# Patient Record
Sex: Female | Born: 1975 | Race: White | Hispanic: No | Marital: Married | Smoking: Never smoker
Health system: Southern US, Community
[De-identification: ages and names within clinical notes are randomized; demographics above are authoritative.]

## PROBLEM LIST (undated history)

## (undated) DIAGNOSIS — F419 Anxiety disorder, unspecified: Secondary | ICD-10-CM

## (undated) DIAGNOSIS — F319 Bipolar disorder, unspecified: Secondary | ICD-10-CM

## (undated) HISTORY — DX: Anxiety disorder, unspecified: F41.9

## (undated) HISTORY — DX: Bipolar disorder, unspecified: F31.9

## (undated) HISTORY — PX: WISDOM TOOTH EXTRACTION: SHX21

---

## 1999-10-28 ENCOUNTER — Emergency Department (HOSPITAL_COMMUNITY): Admission: EM | Admit: 1999-10-28 | Discharge: 1999-10-28 | Payer: Self-pay | Admitting: Emergency Medicine

## 2002-03-31 ENCOUNTER — Other Ambulatory Visit: Admission: RE | Admit: 2002-03-31 | Discharge: 2002-03-31 | Payer: Self-pay | Admitting: Obstetrics and Gynecology

## 2003-07-09 ENCOUNTER — Other Ambulatory Visit: Admission: RE | Admit: 2003-07-09 | Discharge: 2003-07-09 | Payer: Self-pay | Admitting: Obstetrics and Gynecology

## 2004-01-01 ENCOUNTER — Inpatient Hospital Stay (HOSPITAL_COMMUNITY): Admission: AD | Admit: 2004-01-01 | Discharge: 2004-01-01 | Payer: Self-pay | Admitting: Obstetrics and Gynecology

## 2004-01-02 ENCOUNTER — Inpatient Hospital Stay (HOSPITAL_COMMUNITY): Admission: AD | Admit: 2004-01-02 | Discharge: 2004-01-05 | Payer: Self-pay | Admitting: Obstetrics and Gynecology

## 2005-02-09 ENCOUNTER — Other Ambulatory Visit: Admission: RE | Admit: 2005-02-09 | Discharge: 2005-02-09 | Payer: Self-pay | Admitting: Obstetrics and Gynecology

## 2006-03-01 ENCOUNTER — Other Ambulatory Visit: Admission: RE | Admit: 2006-03-01 | Discharge: 2006-03-01 | Payer: Self-pay | Admitting: Obstetrics and Gynecology

## 2007-03-21 ENCOUNTER — Ambulatory Visit (HOSPITAL_COMMUNITY): Admission: RE | Admit: 2007-03-21 | Discharge: 2007-03-21 | Payer: Self-pay | Admitting: Obstetrics and Gynecology

## 2007-04-05 ENCOUNTER — Ambulatory Visit (HOSPITAL_COMMUNITY): Admission: RE | Admit: 2007-04-05 | Discharge: 2007-04-05 | Payer: Self-pay | Admitting: Obstetrics and Gynecology

## 2007-05-17 ENCOUNTER — Ambulatory Visit (HOSPITAL_COMMUNITY): Admission: RE | Admit: 2007-05-17 | Discharge: 2007-05-17 | Payer: Self-pay | Admitting: Obstetrics and Gynecology

## 2007-06-03 ENCOUNTER — Inpatient Hospital Stay (HOSPITAL_COMMUNITY): Admission: AD | Admit: 2007-06-03 | Discharge: 2007-06-03 | Payer: Self-pay | Admitting: Obstetrics and Gynecology

## 2007-08-01 ENCOUNTER — Inpatient Hospital Stay (HOSPITAL_COMMUNITY): Admission: AD | Admit: 2007-08-01 | Discharge: 2007-08-03 | Payer: Self-pay | Admitting: Obstetrics and Gynecology

## 2008-10-22 ENCOUNTER — Emergency Department (HOSPITAL_COMMUNITY): Admission: EM | Admit: 2008-10-22 | Discharge: 2008-10-22 | Payer: Self-pay | Admitting: Internal Medicine

## 2010-09-28 ENCOUNTER — Inpatient Hospital Stay (HOSPITAL_COMMUNITY)
Admission: AD | Admit: 2010-09-28 | Discharge: 2010-09-29 | Payer: Self-pay | Source: Home / Self Care | Attending: Obstetrics and Gynecology | Admitting: Obstetrics and Gynecology

## 2010-09-28 LAB — URINALYSIS, ROUTINE W REFLEX MICROSCOPIC
Ketones, ur: NEGATIVE mg/dL
Leukocytes, UA: NEGATIVE
Nitrite: NEGATIVE
Specific Gravity, Urine: <1.005 — ABNORMAL LOW (ref 1.005–1.030)
Urobilinogen, UA: 0.2 mg/dL (ref 0.0–1.0)
pH: 6 (ref 5.0–8.0)

## 2010-09-28 LAB — URINE MICROSCOPIC-ADD ON

## 2010-09-28 LAB — WET PREP, GENITAL

## 2010-09-29 LAB — GC/CHLAMYDIA PROBE AMP, GENITAL
Chlamydia, DNA Probe: NEGATIVE
GC Probe Amp, Genital: NEGATIVE

## 2010-09-29 LAB — FETAL FIBRONECTIN: Fetal Fibronectin: NEGATIVE

## 2010-09-30 LAB — STREP B DNA PROBE: Strep Group B Ag: NEGATIVE

## 2010-11-28 ENCOUNTER — Inpatient Hospital Stay (HOSPITAL_COMMUNITY)
Admission: AD | Admit: 2010-11-28 | Discharge: 2010-12-01 | DRG: 373 | Disposition: A | Discharge status: Home / Self Care | Payer: BC Managed Care – PPO | Source: Ambulatory Visit | Attending: Obstetrics and Gynecology | Admitting: Obstetrics and Gynecology

## 2010-11-29 LAB — RPR: RPR Ser Ql: NONREACTIVE

## 2010-11-29 LAB — CBC
HCT: 36.5 % (ref 36.0–46.0)
Hemoglobin: 12.1 g/dL (ref 12.0–15.0)
MCH: 29.5 pg (ref 26.0–34.0)
MCHC: 33.2 g/dL (ref 30.0–36.0)
MCV: 89 fL (ref 78.0–100.0)

## 2010-11-30 LAB — CBC
HCT: 33.3 % — ABNORMAL LOW (ref 36.0–46.0)
Hemoglobin: 10.7 g/dL — ABNORMAL LOW (ref 12.0–15.0)
MCH: 28.9 pg (ref 26.0–34.0)
MCHC: 32.1 g/dL (ref 30.0–36.0)
MCV: 90 fL (ref 78.0–100.0)
RDW: 14.5 % (ref 11.5–15.5)
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
## 2011-06-13 LAB — RPR: RPR Ser Ql: NONREACTIVE

## 2011-06-13 LAB — CBC
MCHC: 34.2
MCV: 89.5
Platelets: 247
Platelets: 311
RDW: 13.9
RDW: 14.1
WBC: 14.1 — ABNORMAL HIGH

## 2011-06-15 LAB — URINALYSIS, ROUTINE W REFLEX MICROSCOPIC
Bilirubin Urine: NEGATIVE
Ketones, ur: NEGATIVE
Nitrite: NEGATIVE
Specific Gravity, Urine: 1.02
Urobilinogen, UA: 0.2
pH: 6

## 2011-06-19 LAB — TORCH-IGM(TOXO/ RUB/ CMV/ HSV) W TITER
CMV IgM: 0.59 IV
HSV IgM Antibody Titer: 0.19 IV
Rubella IgM Index: 0.29 IV

## 2011-06-19 LAB — TORCH TITERS-IGG(TOXO/ RUB/ CMV/ HSV)
CMV IgG: >13.2 IV
Toxoplasma IgG Antibody (EIA): <5 IU/mL

## 2011-06-19 LAB — PARVOVIRUS B19 ANTIBODY, IGG AND IGM: Parovirus B19 IgG Abs: 6.8 Index — ABNORMAL HIGH (ref <0.9)

## 2012-12-06 ENCOUNTER — Ambulatory Visit: Payer: Self-pay | Admitting: Physician Assistant

## 2013-09-02 ENCOUNTER — Telehealth: Payer: Self-pay | Admitting: Nurse Practitioner

## 2013-09-02 ENCOUNTER — Encounter (INDEPENDENT_AMBULATORY_CARE_PROVIDER_SITE_OTHER): Payer: Self-pay

## 2013-09-02 ENCOUNTER — Encounter: Payer: Self-pay | Admitting: Family Medicine

## 2013-09-02 ENCOUNTER — Ambulatory Visit (INDEPENDENT_AMBULATORY_CARE_PROVIDER_SITE_OTHER): Payer: BC Managed Care – PPO

## 2013-09-02 ENCOUNTER — Ambulatory Visit (INDEPENDENT_AMBULATORY_CARE_PROVIDER_SITE_OTHER): Payer: BC Managed Care – PPO | Admitting: Family Medicine

## 2013-09-02 VITALS — BP 115/89 | HR 79 | Temp 98.0°F | Ht 65.5 in | Wt 152.0 lb

## 2013-09-02 DIAGNOSIS — M25531 Pain in right wrist: Secondary | ICD-10-CM

## 2013-09-02 DIAGNOSIS — M25579 Pain in unspecified ankle and joints of unspecified foot: Secondary | ICD-10-CM

## 2013-09-02 DIAGNOSIS — M25572 Pain in left ankle and joints of left foot: Secondary | ICD-10-CM

## 2013-09-02 DIAGNOSIS — M25539 Pain in unspecified wrist: Secondary | ICD-10-CM

## 2013-09-02 DIAGNOSIS — M25571 Pain in right ankle and joints of right foot: Secondary | ICD-10-CM

## 2013-09-02 MED ORDER — NAPROXEN 500 MG PO TABS: 500.0000 mg | ORAL_TABLET | Freq: Two times a day (BID) | ORAL | Status: DC

## 2013-09-02 NOTE — Patient Instructions (Signed)
Wrist Pain Wrist injuries are frequent in adults and children. A sprain is an injury to the ligaments that hold your bones together. A strain is an injury to muscle or muscle cord-like structures (tendons) from stretching or pulling. Generally, when wrists are moderately tender to touch following a fall or injury, a break in the bone (fracture) may be present. Most wrist sprains or strains are better in 3 to 5 days, but complete healing may take several weeks. HOME CARE INSTRUCTIONS   Put ice on the injured area.  Put ice in a plastic bag.  Place a towel between your skin and the bag.  Leave the ice on for 15-20 minutes, 03-04 times a day, for the first 2 days.  Keep your arm raised above the level of your heart whenever possible to reduce swelling and pain.  Rest the injured area for at least 48 hours or as directed by your caregiver.  If a splint or elastic bandage has been applied, use it for as long as directed by your caregiver or until seen by a caregiver for a follow-up exam.  Only take over-the-counter or prescription medicines for pain, discomfort, or fever as directed by your caregiver.  Keep all follow-up appointments. You may need to follow up with a specialist or have follow-up X-rays. Improvement in pain level is not a guarantee that you did not fracture a bone in your wrist. The only way to determine whether or not you have a broken bone is by X-ray. SEEK IMMEDIATE MEDICAL CARE IF:   Your fingers are swollen, very red, white, or cold and blue.  Your fingers are numb or tingling.  You have increasing pain.  You have difficulty moving your fingers. MAKE SURE YOU:   Understand these instructions.  Will watch your condition.  Will get help right away if you are not doing well or get worse. Document Released: 05/31/2005 Document Revised: 11/13/2011 Document Reviewed: 10/12/2010 ExitCare Patient Information 2014 [employer].  

## 2013-09-02 NOTE — Telephone Encounter (Signed)
appt at 2:15

## 2013-09-02 NOTE — Progress Notes (Signed)
   Subjective:    Patient ID: Priscilla Ramos, female    DOB: 11-08-1975, 37 y.o.   MRN: [id number]  HPI This 37 y.o. female presents for evaluation of Right wrist pain and left lateral ankle discomfort. She fell and injured her right wrist a few days ago and she twisted her left ankle a few weeks ago And her left lateral ankle is swollen.   Review of Systems C/o right wrist and left ankle discomfort. No chest pain, SOB, HA, dizziness, vision change, N/V, diarrhea, constipation, dysuria, urinary urgency or frequency, myalgias, arthralgias or rash.     Objective:   Physical Exam  Vital signs noted  Well developed well nourished female.  HEENT - Head atraumatic Normocephalic                Eyes - PERRLA, Conjuctiva - clear Sclera- Clear EOMI Respiratory - Lungs CTA bilateral Cardiac - RRR S1 and S2 without murmur MS - TTP right wrist and TTP left lateral malleolus.    Xray of left ankle w/o fracture Xray of right wrist w/o fracture Prelimnary reading by Chrissie Noa Oxford,FNP    Assessment & Plan:  Pain in joint, ankle and foot, right - Plan: naproxen (NAPROSYN) 500 MG tablet po bid 7-10 days  Pain in joint, ankle and foot, left - Plan: DG Ankle Complete Left, naproxen (NAPROSYN) 500 MG tablet  Wrist pain, right - Plan: DG Wrist Complete Right, naproxen (NAPROSYN) 500 MG tablet bid x 7 days Cock up splint.

## 2014-09-22 ENCOUNTER — Other Ambulatory Visit: Payer: Self-pay | Admitting: Obstetrics and Gynecology

## 2014-09-22 DIAGNOSIS — N63 Unspecified lump in unspecified breast: Secondary | ICD-10-CM

## 2014-09-29 ENCOUNTER — Ambulatory Visit
Admission: RE | Admit: 2014-09-29 | Discharge: 2014-09-29 | Disposition: A | Discharge status: Home / Self Care | Payer: BC Managed Care – PPO | Source: Ambulatory Visit | Attending: Obstetrics and Gynecology | Admitting: Obstetrics and Gynecology

## 2014-09-29 DIAGNOSIS — N63 Unspecified lump in unspecified breast: Secondary | ICD-10-CM

## 2015-12-21 ENCOUNTER — Ambulatory Visit: Payer: Self-pay

## 2016-01-07 ENCOUNTER — Ambulatory Visit (INDEPENDENT_AMBULATORY_CARE_PROVIDER_SITE_OTHER): Payer: BC Managed Care – PPO | Admitting: Family Medicine

## 2016-01-07 ENCOUNTER — Encounter (INDEPENDENT_AMBULATORY_CARE_PROVIDER_SITE_OTHER): Payer: Self-pay

## 2016-01-07 ENCOUNTER — Encounter: Payer: Self-pay | Admitting: Family Medicine

## 2016-01-07 VITALS — BP 110/76 | HR 92 | Temp 97.2°F | Ht 65.5 in | Wt 150.4 lb

## 2016-01-07 DIAGNOSIS — L259 Unspecified contact dermatitis, unspecified cause: Secondary | ICD-10-CM | POA: Diagnosis not present

## 2016-01-07 MED ORDER — TRIAMCINOLONE ACETONIDE 0.1 % EX CREA: 1.0000 "application " | TOPICAL_CREAM | Freq: Two times a day (BID) | CUTANEOUS | Status: DC

## 2016-01-07 NOTE — Progress Notes (Signed)
BP 110/76 mmHg  Pulse 92  Temp(Src) 97.2 F (36.2 C) (Oral)  Ht 5' 5.5" (1.664 m)  Wt 150 lb 6.4 oz (68.221 kg)  BMI 24.64 kg/m2   Subjective:    Patient ID: Priscilla Ramos Age, female    DOB: [birth year], 40 y.o.   MRN: [id number]  HPI: KIMMBERLY Ramos is a 40 y.o. female presenting on 01/07/2016 for Itchy rash all over body   HPI Rash Patient just finished a steroid treatment that was a taper about for 5 days ago for contact dermatitis from poison ivy. Since that cleared up she started to develop this rash all over her body that is very pruritic. She does have a lot of pets but denies seeing any of him having fleas or bugs but the sites do look like small bites from some kind of insect. The rashes on her back chest and arms and is scattered. There is none on her hands.  Relevant past medical, surgical, family and social history reviewed and updated as indicated. Interim medical history since our last visit reviewed. Allergies and medications reviewed and updated.  Review of Systems  Constitutional: Negative for fever and chills.  HENT: Negative for congestion, ear discharge and ear pain.   Eyes: Negative for redness and visual disturbance.  Respiratory: Negative for chest tightness and shortness of breath.   Cardiovascular: Negative for chest pain and leg swelling.  Genitourinary: Negative for dysuria and difficulty urinating.  Musculoskeletal: Negative for back pain and gait problem.  Skin: Positive for rash.  Neurological: Negative for light-headedness and headaches.  Psychiatric/Behavioral: Negative for behavioral problems and agitation.  All other systems reviewed and are negative.   Per HPI unless specifically indicated above     Medication List       This list is accurate as of: 01/07/16  3:08 PM.  Always use your most recent med list.               atomoxetine 60 MG capsule  Commonly known as:  STRATTERA  Take 60 mg by mouth 2 (two) times daily with a meal.     escitalopram 20 MG tablet  Commonly known as:  LEXAPRO  Take 20 mg by mouth daily.     lamoTRIgine 200 MG tablet  Commonly known as:  LAMICTAL  Take 200 mg by mouth daily.     tolterodine 4 MG 24 hr capsule  Commonly known as:  DETROL LA  Take 4 mg by mouth daily.     triamcinolone cream 0.1 %  Commonly known as:  KENALOG  Apply 1 application topically 2 (two) times daily.           Objective:    BP 110/76 mmHg  Pulse 92  Temp(Src) 97.2 F (36.2 C) (Oral)  Ht 5' 5.5" (1.664 m)  Wt 150 lb 6.4 oz (68.221 kg)  BMI 24.64 kg/m2  Wt Readings from Last 3 Encounters:  01/07/16 150 lb 6.4 oz (68.221 kg)  09/02/13 152 lb (68.947 kg)    Physical Exam  Constitutional: She is oriented to person, place, and time. She appears well-developed and well-nourished. No distress.  Eyes: Conjunctivae and EOM are normal. Pupils are equal, round, and reactive to light.  Neck: Neck supple. No thyromegaly present.  Cardiovascular: Normal rate, regular rhythm, normal heart sounds and intact distal pulses.   No murmur heard. Pulmonary/Chest: Effort normal and breath sounds normal. No respiratory distress. She has no wheezes.  Musculoskeletal: Normal range of motion. She  exhibits no edema or tenderness.  Lymphadenopathy:    She has no cervical adenopathy.  Neurological: She is alert and oriented to person, place, and time. Coordination normal.  Skin: Skin is warm and dry. Rash noted. Rash is papular (Fine papules scattered on her back chest and arms. Excoriations throughout some of them. No drainage. None on hands and face groin or legs.). She is not diaphoretic.  Psychiatric: She has a normal mood and affect. Her behavior is normal.  Nursing note and vitals reviewed.   Results for orders placed or performed during the hospital encounter of 11/28/10  CBC  Result Value Ref Range   WBC 14.8 (H) 4.0 - 10.5 K/uL   RBC 4.10 3.87 - 5.11 MIL/uL   Hemoglobin 12.1 12.0 - 15.0 g/dL   HCT 30.836.5 65.736.0  - 84.646.0 %   MCV 89.0 78.0 - 100.0 fL   MCH 29.5 26.0 - 34.0 pg   MCHC 33.2 30.0 - 36.0 g/dL   RDW 96.214.3 95.211.5 - 84.115.5 %   Platelets 252 150 - 400 K/uL  RPR  Result Value Ref Range   RPR Ser Ql NON REACTIVE NON REACTIVE  CBC  Result Value Ref Range   WBC 9.2 4.0 - 10.5 K/uL   RBC 3.70 (L) 3.87 - 5.11 MIL/uL   Hemoglobin 10.7 (L) 12.0 - 15.0 g/dL   HCT 32.433.3 (L) 40.136.0 - 02.746.0 %   MCV 90.0 78.0 - 100.0 fL   MCH 28.9 26.0 - 34.0 pg   MCHC 32.1 30.0 - 36.0 g/dL   RDW 25.314.5 66.411.5 - 40.315.5 %   Platelets 198 150 - 400 K/uL      Assessment & Plan:       Problem List Items Addressed This Visit    None    Visit Diagnoses    Contact dermatitis    -  Primary    Relevant Medications    triamcinolone cream (KENALOG) 0.1 %        Follow up plan: Return in about 4 weeks (around 02/04/2016), or if symptoms worsen or fail to improve, for Well adult exam and fasting labs.  Counseling provided for all of the vaccine components No orders of the defined types were placed in this encounter.    Arville CareJoshua [name], MD [address] [facility] 01/07/2016, 3:08 PM

## 2016-05-09 ENCOUNTER — Other Ambulatory Visit: Payer: Self-pay | Admitting: Obstetrics and Gynecology

## 2016-05-09 NOTE — H&P (Signed)
Ardeen FillersJenny H Ramos is a 40 y.o. female  P: 3-0-0-3, presents for vaginal hysterectomy, anterior-posterior colporrhphy and placement of tension free vaginal tape because of symptomatic pelvic relaxation. Over the past 3 years the patient has developed worsening symptoms of spontaneous incontinence as well as leaking of urine with cough, physical activities and lifting.  She has tried bladder training and pelvic physical therapy with minimal change.  For her urge symptoms she was placed on Oxytutinin with minimal relief.  She denies any urinary hesistancy, hematuria, dysuria or history of renal stones.  She admits to post void dribbling, occasional dyspareunia and constipation (with the need to manually assist with evacuation).  Patient's menstrual periods are monthly with a 5 day flow.  She changes her protection every 4-6 hours and has minimal cramping. To further evaluate her bladder issues he patient underwent Lumax, urodynamic study that demonstrated a large bladder capacity but otherwise inconclusive results.  A review of both medical and surgical management options were given to the patient however,  since she has completed childbearing she has decided to proceed with surgical management of her bladder symptoms.   Past Medical History  OB History: G: 3;  P: 3-0-0-3  SVB: 2005, 2008 and 2012 with largest infant weighing 8bls. 15 ounces  GYN History: menarche: 40 YO    LMP: 04/11/2016   Contracepton vasectomy  The patient denies history of sexually transmitted disease.  Remote history of abnormal PAP smear that was repeated and returned normal.   Last PAP smear: 2016-normal   Medical History: Bipolar Disorder, Anxiety, Psoriasis and ADHD  Surgical History:  Dental (Wisdom Teeth and Gum Graft) Denies problems with anesthesia or history of blood transfusions  Family History: Cardiovascular Disease, Hyperlipidemia and Parkinson's Disease  Social History:  Married and employed as a Runner, broadcasting/film/videoTeacher;  Former  Smoker (quit 15 years ago) and Occasional Alcohol Use   Medication: Escitalopram 20 mg daily Lamotrigine 200 mg  2 po daily Strattera 80 mg daily Triamcinolone Cream 0.1%  topically as directed prn  No Known Allergies  ROS: Admits to glasses/contact lenses and skin rash due to psoriasis but   denies headache, vision changes, nasal congestion, dysphagia, tinnitus, dizziness, hoarseness, cough,  chest pain, shortness of breath, nausea, vomiting, diarrhea,constipation,  urinary frequency,  dysuria, hematuria, vaginitis symptoms, pelvic pain, swelling of joints,easy bruising,  myalgias, arthralgias,  unexplained weight loss and except as is mentioned in the history of present illness, patient's review of systems is otherwise negative.     Physical Exam    Bp: 100/60  P: 88 bpm.     R: 18   Temperature: 98.5 degrees F orally   Weight:  154 lbs.  Height: 5\' 5"     BMI: 25.2  Neck: supple without masses or thyromegaly Lungs: clear to auscultation Heart: regular rate and rhythm Abdomen: soft, non-tender and no organomegaly Pelvic:EGBUS- wnl; vagina-3/4 cystocele and 2/4 rectocele; uterus-retroverted, normal size with  descensus to half of vagina, cervix without lesions or motion tenderness; adnexae-no tenderness or masses Extremities:  no clubbing, cyanosis or edema   Assesment: Mixed Urinary Incontinence           Symptomatic Cystocele           Symptomatic Rectocele   Disposition:  A discussion was held with patient regarding the indication for her procedure(s) along with the risks, which include but are not limited to:  reaction to anesthesia, damage to adjacent organs, infection, erosion of tension free vaginal tape, worsening of symptoms, urinary  retention and excessive bleeding. The patient verbalized understanding of these risks and has consented to proceed with a Total Vaginal Hysterectomy, Anterior-Posterior Colporrhaphy, Placement of Tension Free Vaginal Tape and Cystoscopy at  [facility] [place] on May 18, 2016.   CSN# [id number]   [place] J. Lowell Guitar, PA-C  for Dr. Crist Fat.Rhealyn Cullen

## 2016-05-11 NOTE — Patient Instructions (Addendum)
Your procedure is scheduled on:  Thursday, Sept. 14, 2017  [facility] at:  7:00 AM  Pick up the phone at the desk and dial [phone].  Call this number if you have problems the morning of surgery: [phone].  Remember: Do NOT eat food or drink after:  Midnight Wednesday  Take these medicines the morning of surgery with a SIP OF WATER:  Escitalopram, Lamotrigine,  Do NOT wear jewelry (body piercing), metal hair clips/bobby pins, make-up, or nail polish. Do NOT wear lotions, powders, or perfumes.  You may wear deodorant. Do NOT shave for 48 hours prior to surgery. Do NOT bring valuables to the hospital. Contacts, dentures, or bridgework may not be worn into surgery.  Leave suitcase in car.  After surgery it may be brought to your room.  For patients admitted to the hospital, checkout time is 11:00 AM the day of discharge.

## 2016-05-12 ENCOUNTER — Encounter (HOSPITAL_COMMUNITY): Payer: Self-pay

## 2016-05-12 ENCOUNTER — Encounter (HOSPITAL_COMMUNITY)
Admission: RE | Admit: 2016-05-12 | Discharge: 2016-05-12 | Disposition: A | Discharge status: Home / Self Care | Payer: BC Managed Care – PPO | Source: Ambulatory Visit | Attending: Obstetrics and Gynecology | Admitting: Obstetrics and Gynecology

## 2016-05-12 DIAGNOSIS — Z01818 Encounter for other preprocedural examination: Secondary | ICD-10-CM | POA: Diagnosis not present

## 2016-05-12 LAB — BASIC METABOLIC PANEL
ANION GAP: 6 (ref 5–15)
BUN: 15 mg/dL (ref 6–20)
CALCIUM: 9.6 mg/dL (ref 8.9–10.3)
CO2: 27 mmol/L (ref 22–32)
Chloride: 103 mmol/L (ref 101–111)
Creatinine, Ser: 0.85 mg/dL (ref 0.44–1.00)
Glucose, Bld: 90 mg/dL (ref 65–99)
POTASSIUM: 5.1 mmol/L (ref 3.5–5.1)
SODIUM: 136 mmol/L (ref 135–145)

## 2016-05-12 LAB — CBC
HEMATOCRIT: 38.7 % (ref 36.0–46.0)
HEMOGLOBIN: 12.9 g/dL (ref 12.0–15.0)
MCH: 29.3 pg (ref 26.0–34.0)
MCHC: 33.3 g/dL (ref 30.0–36.0)
MCV: 88 fL (ref 78.0–100.0)
PLATELETS: 364 10*3/uL (ref 150–400)
RBC: 4.4 MIL/uL (ref 3.87–5.11)
RDW: 13.5 % (ref 11.5–15.5)
WBC: 6.6 10*3/uL (ref 4.0–10.5)

## 2016-05-17 ENCOUNTER — Encounter (HOSPITAL_COMMUNITY): Payer: Self-pay | Admitting: Anesthesiology

## 2016-05-17 NOTE — Anesthesia Preprocedure Evaluation (Addendum)
Anesthesia Evaluation  Patient identified by MRN, date of birth, ID band Patient awake    Reviewed: Allergy & Precautions, H&P , NPO status , Patient's Chart, lab work & pertinent test results  Airway Mallampati: I  TM Distance: >3 FB Neck ROM: full    Dental no notable dental hx. (+) Teeth Intact   Pulmonary neg pulmonary ROS,    Pulmonary exam normal breath sounds clear to auscultation       Cardiovascular negative cardio ROS Normal cardiovascular exam Rhythm:regular Rate:Normal     Neuro/Psych negative neurological ROS     GI/Hepatic negative GI ROS, Neg liver ROS,   Endo/Other  negative endocrine ROS  Renal/GU negative Renal ROS     Musculoskeletal   Abdominal   Peds  Hematology negative hematology ROS (+)   Anesthesia Other Findings   Reproductive/Obstetrics negative OB ROS                            Anesthesia Physical Anesthesia Plan  ASA: II  Anesthesia Plan: Combined Spinal and Epidural   Post-op Pain Management:    Induction:   Airway Management Planned:   Additional Equipment:   Intra-op Plan:   Post-operative Plan:   Informed Consent: I have reviewed the patients History and Physical, chart, labs and discussed the procedure including the risks, benefits and alternatives for the proposed anesthesia with the patient or authorized representative who has indicated his/her understanding and acceptance.   Dental Advisory Given and History available from chart only  Plan Discussed with: CRNA and Surgeon  Anesthesia Plan Comments:         Anesthesia Quick Evaluation

## 2016-05-18 ENCOUNTER — Ambulatory Visit (HOSPITAL_COMMUNITY): Payer: BC Managed Care – PPO | Admitting: Anesthesiology

## 2016-05-18 ENCOUNTER — Observation Stay (HOSPITAL_COMMUNITY)
Admission: AD | Admit: 2016-05-18 | Discharge: 2016-05-20 | Disposition: A | Discharge status: Home / Self Care | Payer: BC Managed Care – PPO | Source: Ambulatory Visit | Attending: Obstetrics and Gynecology | Admitting: Obstetrics and Gynecology

## 2016-05-18 ENCOUNTER — Encounter (HOSPITAL_COMMUNITY): Payer: Self-pay | Admitting: *Deleted

## 2016-05-18 ENCOUNTER — Encounter (HOSPITAL_COMMUNITY)
Admission: AD | Disposition: A | Discharge status: Home / Self Care | Payer: Self-pay | Source: Ambulatory Visit | Attending: Obstetrics and Gynecology

## 2016-05-18 DIAGNOSIS — Z90710 Acquired absence of both cervix and uterus: Secondary | ICD-10-CM | POA: Diagnosis present

## 2016-05-18 DIAGNOSIS — N812 Incomplete uterovaginal prolapse: Principal | ICD-10-CM | POA: Insufficient documentation

## 2016-05-18 DIAGNOSIS — Z87891 Personal history of nicotine dependence: Secondary | ICD-10-CM | POA: Insufficient documentation

## 2016-05-18 DIAGNOSIS — N3946 Mixed incontinence: Secondary | ICD-10-CM | POA: Diagnosis not present

## 2016-05-18 DIAGNOSIS — N819 Female genital prolapse, unspecified: Secondary | ICD-10-CM | POA: Diagnosis present

## 2016-05-18 HISTORY — PX: VAGINAL HYSTERECTOMY: SHX2639

## 2016-05-18 HISTORY — PX: CYSTOSCOPY: SHX5120

## 2016-05-18 HISTORY — PX: ANTERIOR AND POSTERIOR REPAIR: SHX5121

## 2016-05-18 HISTORY — PX: BLADDER SUSPENSION: SHX72

## 2016-05-18 LAB — PREGNANCY, URINE: Preg Test, Ur: NEGATIVE

## 2016-05-18 SURGERY — HYSTERECTOMY, VAGINAL
Anesthesia: Spinal | Site: Vagina

## 2016-05-18 MED ORDER — KETOROLAC TROMETHAMINE 30 MG/ML IJ SOLN: 30.0000 mg | Freq: Four times a day (QID) | INTRAMUSCULAR | Status: DC | PRN

## 2016-05-18 MED ORDER — HYDROMORPHONE HCL 1 MG/ML IJ SOLN
INTRAMUSCULAR | Status: AC
  Filled 2016-05-18: qty 1

## 2016-05-18 MED ORDER — SIMETHICONE 80 MG PO CHEW: 80.0000 mg | CHEWABLE_TABLET | Freq: Four times a day (QID) | ORAL | Status: DC | PRN

## 2016-05-18 MED ORDER — CEFAZOLIN SODIUM-DEXTROSE 2-4 GM/100ML-% IV SOLN
2.0000 g | INTRAVENOUS | Status: AC
Start: 2016-05-18 — End: 2016-05-18
  Administered 2016-05-18: 2 g via INTRAVENOUS

## 2016-05-18 MED ORDER — NALBUPHINE HCL 10 MG/ML IJ SOLN
5.0000 mg | Freq: Once | INTRAMUSCULAR | Status: AC | PRN
  Administered 2016-05-18: 5 mg via SUBCUTANEOUS

## 2016-05-18 MED ORDER — SODIUM CHLORIDE 0.9 % IJ SOLN
INTRAMUSCULAR | Status: AC
Start: 2016-05-18 — End: 2016-05-18
  Filled 2016-05-18: qty 100

## 2016-05-18 MED ORDER — ONDANSETRON HCL 4 MG PO TABS: 4.0000 mg | ORAL_TABLET | Freq: Four times a day (QID) | ORAL | Status: DC | PRN

## 2016-05-18 MED ORDER — IBUPROFEN 600 MG PO TABS
600.0000 mg | ORAL_TABLET | Freq: Four times a day (QID) | ORAL | Status: DC | PRN
  Administered 2016-05-19 – 2016-05-20 (×2): 600 mg via ORAL
  Filled 2016-05-18 (×3): qty 1

## 2016-05-18 MED ORDER — VASOPRESSIN 20 UNIT/ML IV SOLN
INTRAVENOUS | Status: AC
  Filled 2016-05-18: qty 1

## 2016-05-18 MED ORDER — NALBUPHINE HCL 10 MG/ML IJ SOLN: 5.0000 mg | INTRAMUSCULAR | Status: DC | PRN

## 2016-05-18 MED ORDER — DIPHENHYDRAMINE HCL 50 MG/ML IJ SOLN
INTRAMUSCULAR | Status: AC
  Filled 2016-05-18: qty 1

## 2016-05-18 MED ORDER — ACETAMINOPHEN 500 MG PO TABS
1000.0000 mg | ORAL_TABLET | Freq: Four times a day (QID) | ORAL | Status: AC
  Administered 2016-05-18 – 2016-05-19 (×2): 1000 mg via ORAL
  Filled 2016-05-18 (×3): qty 2

## 2016-05-18 MED ORDER — NALOXONE HCL 2 MG/2ML IJ SOSY: 1.0000 ug/kg/h | PREFILLED_SYRINGE | INTRAVENOUS | Status: DC | PRN

## 2016-05-18 MED ORDER — ESCITALOPRAM OXALATE 20 MG PO TABS
20.0000 mg | ORAL_TABLET | Freq: Every day | ORAL | Status: DC
  Administered 2016-05-19 – 2016-05-20 (×2): 20 mg via ORAL
  Filled 2016-05-18 (×2): qty 1

## 2016-05-18 MED ORDER — LIDOCAINE-EPINEPHRINE (PF) 1 %-1:200000 IJ SOLN
INTRAMUSCULAR | Status: AC
Start: 2016-05-18 — End: 2016-05-18
  Filled 2016-05-18: qty 30

## 2016-05-18 MED ORDER — SCOPOLAMINE 1 MG/3DAYS TD PT72
1.0000 | MEDICATED_PATCH | Freq: Once | TRANSDERMAL | Status: DC
  Administered 2016-05-18: 1.5 mg via TRANSDERMAL

## 2016-05-18 MED ORDER — IBUPROFEN 600 MG PO TABS: 600.0000 mg | ORAL_TABLET | Freq: Four times a day (QID) | ORAL | Status: DC | PRN

## 2016-05-18 MED ORDER — HYDROMORPHONE HCL 1 MG/ML IJ SOLN
0.2500 mg | INTRAMUSCULAR | Status: DC | PRN
  Administered 2016-05-18 (×3): 0.5 mg via INTRAVENOUS

## 2016-05-18 MED ORDER — SCOPOLAMINE 1 MG/3DAYS TD PT72
MEDICATED_PATCH | TRANSDERMAL | Status: AC
  Filled 2016-05-18: qty 1

## 2016-05-18 MED ORDER — SODIUM BICARBONATE 8.4 % IV SOLN
INTRAVENOUS | Status: AC
  Filled 2016-05-18: qty 50

## 2016-05-18 MED ORDER — MIDAZOLAM HCL 2 MG/2ML IJ SOLN
INTRAMUSCULAR | Status: AC
  Filled 2016-05-18: qty 2

## 2016-05-18 MED ORDER — STERILE WATER FOR IRRIGATION IR SOLN
Status: DC | PRN
  Administered 2016-05-18: 1000 mL

## 2016-05-18 MED ORDER — ONDANSETRON HCL 4 MG/2ML IJ SOLN: 4.0000 mg | Freq: Three times a day (TID) | INTRAMUSCULAR | Status: DC | PRN

## 2016-05-18 MED ORDER — LAMOTRIGINE 200 MG PO TABS
200.0000 mg | ORAL_TABLET | Freq: Every day | ORAL | Status: DC
  Administered 2016-05-19 – 2016-05-20 (×2): 200 mg via ORAL
  Filled 2016-05-18 (×2): qty 1

## 2016-05-18 MED ORDER — LACTATED RINGERS IV SOLN
INTRAVENOUS | Status: DC
  Administered 2016-05-18 (×3): via INTRAVENOUS

## 2016-05-18 MED ORDER — DIPHENHYDRAMINE HCL 25 MG PO CAPS
25.0000 mg | ORAL_CAPSULE | ORAL | Status: DC | PRN
  Administered 2016-05-18: 25 mg via ORAL
  Filled 2016-05-18 (×2): qty 1

## 2016-05-18 MED ORDER — PROPOFOL 500 MG/50ML IV EMUL
INTRAVENOUS | Status: DC | PRN
  Administered 2016-05-18: 50 ug/kg/min via INTRAVENOUS

## 2016-05-18 MED ORDER — MEPERIDINE HCL 25 MG/ML IJ SOLN
6.2500 mg | INTRAMUSCULAR | Status: DC | PRN
Start: 2016-05-18 — End: 2016-05-18

## 2016-05-18 MED ORDER — NALOXONE HCL 0.4 MG/ML IJ SOLN: 0.4000 mg | INTRAMUSCULAR | Status: DC | PRN

## 2016-05-18 MED ORDER — NALBUPHINE HCL 10 MG/ML IJ SOLN
INTRAMUSCULAR | Status: AC
  Filled 2016-05-18: qty 1

## 2016-05-18 MED ORDER — PROMETHAZINE HCL 25 MG/ML IJ SOLN: 6.2500 mg | INTRAMUSCULAR | Status: DC | PRN

## 2016-05-18 MED ORDER — OXYCODONE-ACETAMINOPHEN 5-325 MG PO TABS
1.0000 | ORAL_TABLET | ORAL | Status: DC | PRN
  Administered 2016-05-19: 2 via ORAL
  Administered 2016-05-19: 1 via ORAL
  Administered 2016-05-19: 2 via ORAL
  Administered 2016-05-19: 1 via ORAL
  Administered 2016-05-20: 2 via ORAL
  Filled 2016-05-18: qty 2
  Filled 2016-05-18: qty 1
  Filled 2016-05-18 (×3): qty 2
  Filled 2016-05-18: qty 1

## 2016-05-18 MED ORDER — LACTATED RINGERS IV SOLN
INTRAVENOUS | Status: DC | PRN
  Administered 2016-05-18: 09:00:00 via INTRAVENOUS

## 2016-05-18 MED ORDER — LIDOCAINE HCL 1 % IJ SOLN
INTRAMUSCULAR | Status: AC
  Filled 2016-05-18: qty 20

## 2016-05-18 MED ORDER — ONDANSETRON HCL 4 MG/2ML IJ SOLN
INTRAMUSCULAR | Status: AC
  Filled 2016-05-18: qty 2

## 2016-05-18 MED ORDER — MENTHOL 3 MG MT LOZG: 1.0000 | LOZENGE | OROMUCOSAL | Status: DC | PRN

## 2016-05-18 MED ORDER — FENTANYL CITRATE (PF) 100 MCG/2ML IJ SOLN
INTRAMUSCULAR | Status: DC | PRN
  Administered 2016-05-18: 100 ug via EPIDURAL
  Administered 2016-05-18: 100 ug via INTRAVENOUS
  Administered 2016-05-18: 25 ug via INTRAVENOUS

## 2016-05-18 MED ORDER — MORPHINE SULFATE (PF) 0.5 MG/ML IJ SOLN
INTRAMUSCULAR | Status: AC
  Filled 2016-05-18: qty 10

## 2016-05-18 MED ORDER — SODIUM CHLORIDE 0.9% FLUSH: 3.0000 mL | INTRAVENOUS | Status: DC | PRN

## 2016-05-18 MED ORDER — LIDOCAINE HCL (CARDIAC) 20 MG/ML IV SOLN
INTRAVENOUS | Status: AC
  Filled 2016-05-18: qty 5

## 2016-05-18 MED ORDER — HYDROMORPHONE HCL 1 MG/ML IJ SOLN
INTRAMUSCULAR | Status: AC
  Administered 2016-05-18: 0.5 mg via INTRAVENOUS
  Filled 2016-05-18: qty 1

## 2016-05-18 MED ORDER — MIDAZOLAM HCL 2 MG/2ML IJ SOLN
INTRAMUSCULAR | Status: DC | PRN
  Administered 2016-05-18: 2 mg via INTRAVENOUS

## 2016-05-18 MED ORDER — ATOMOXETINE HCL 60 MG PO CAPS: 60.0000 mg | ORAL_CAPSULE | Freq: Two times a day (BID) | ORAL | Status: DC

## 2016-05-18 MED ORDER — FENTANYL CITRATE (PF) 250 MCG/5ML IJ SOLN
INTRAMUSCULAR | Status: AC
  Filled 2016-05-18: qty 5

## 2016-05-18 MED ORDER — DEXTROSE IN LACTATED RINGERS 5 % IV SOLN
INTRAVENOUS | Status: DC
  Administered 2016-05-18 – 2016-05-19 (×2): via INTRAVENOUS

## 2016-05-18 MED ORDER — KETOROLAC TROMETHAMINE 30 MG/ML IJ SOLN
30.0000 mg | Freq: Once | INTRAMUSCULAR | Status: AC
  Administered 2016-05-18: 30 mg via INTRAVENOUS

## 2016-05-18 MED ORDER — SCOPOLAMINE 1 MG/3DAYS TD PT72: 1.0000 | MEDICATED_PATCH | Freq: Once | TRANSDERMAL | Status: DC

## 2016-05-18 MED ORDER — NALBUPHINE HCL 10 MG/ML IJ SOLN: 5.0000 mg | Freq: Once | INTRAMUSCULAR | Status: AC | PRN

## 2016-05-18 MED ORDER — PROPOFOL 10 MG/ML IV BOLUS
INTRAVENOUS | Status: AC
  Filled 2016-05-18: qty 20

## 2016-05-18 MED ORDER — MORPHINE SULFATE (PF) 0.5 MG/ML IJ SOLN
INTRAMUSCULAR | Status: DC | PRN
  Administered 2016-05-18: 3 mg via EPIDURAL

## 2016-05-18 MED ORDER — MORPHINE SULFATE-NACL 0.5-0.9 MG/ML-% IV SOSY
PREFILLED_SYRINGE | INTRAVENOUS | Status: AC
  Filled 2016-05-18: qty 1

## 2016-05-18 MED ORDER — BUPIVACAINE HCL (PF) 0.75 % IJ SOLN
INTRAMUSCULAR | Status: DC | PRN
  Administered 2016-05-18: 1.6 mL via INTRATHECAL

## 2016-05-18 MED ORDER — ESTRADIOL 0.1 MG/GM VA CREA
TOPICAL_CREAM | VAGINAL | Status: AC
  Filled 2016-05-18: qty 42.5

## 2016-05-18 MED ORDER — PROPOFOL 500 MG/50ML IV EMUL
INTRAVENOUS | Status: AC
  Filled 2016-05-18: qty 50

## 2016-05-18 MED ORDER — FENTANYL CITRATE (PF) 100 MCG/2ML IJ SOLN
INTRAMUSCULAR | Status: AC
  Filled 2016-05-18: qty 2

## 2016-05-18 MED ORDER — LIDOCAINE-EPINEPHRINE (PF) 1 %-1:200000 IJ SOLN
INTRAMUSCULAR | Status: DC | PRN
  Administered 2016-05-18: 23 mL

## 2016-05-18 MED ORDER — DIPHENHYDRAMINE HCL 50 MG/ML IJ SOLN
12.5000 mg | INTRAMUSCULAR | Status: DC | PRN
  Administered 2016-05-18: 12.5 mg via INTRAVENOUS

## 2016-05-18 MED ORDER — ONDANSETRON HCL 4 MG/2ML IJ SOLN: 4.0000 mg | Freq: Four times a day (QID) | INTRAMUSCULAR | Status: DC | PRN

## 2016-05-18 MED ORDER — MEPERIDINE HCL 25 MG/ML IJ SOLN: 6.2500 mg | INTRAMUSCULAR | Status: DC | PRN

## 2016-05-18 MED ORDER — ESTRADIOL 0.1 MG/GM VA CREA
TOPICAL_CREAM | VAGINAL | Status: DC | PRN
  Administered 2016-05-18: 1 via VAGINAL

## 2016-05-18 MED ORDER — KETOROLAC TROMETHAMINE 30 MG/ML IJ SOLN
30.0000 mg | Freq: Four times a day (QID) | INTRAMUSCULAR | Status: DC | PRN
  Administered 2016-05-18 – 2016-05-19 (×2): 30 mg via INTRAVENOUS
  Filled 2016-05-18 (×3): qty 1

## 2016-05-18 MED ORDER — LIDOCAINE-EPINEPHRINE (PF) 2 %-1:200000 IJ SOLN
INTRAMUSCULAR | Status: AC
Start: 2016-05-18 — End: 2016-05-18
  Filled 2016-05-18: qty 20

## 2016-05-18 MED ORDER — VASOPRESSIN 20 UNIT/ML IV SOLN
INTRAVENOUS | Status: DC | PRN
  Administered 2016-05-18 (×2): 20 mL via INTRAMUSCULAR
  Administered 2016-05-18: 10 mL via INTRAMUSCULAR

## 2016-05-18 MED ORDER — LIDOCAINE-EPINEPHRINE (PF) 2 %-1:200000 IJ SOLN
INTRAMUSCULAR | Status: DC | PRN
  Administered 2016-05-18 (×2): 3 mL via EPIDURAL
  Administered 2016-05-18: 2 mL via EPIDURAL
  Administered 2016-05-18 (×2): 3 mL via EPIDURAL
  Administered 2016-05-18: 1 mL via EPIDURAL
  Administered 2016-05-18 (×2): 3 mL via EPIDURAL

## 2016-05-18 MED ORDER — KETOROLAC TROMETHAMINE 30 MG/ML IJ SOLN
INTRAMUSCULAR | Status: AC
  Filled 2016-05-18: qty 1

## 2016-05-18 SURGICAL SUPPLY — 48 items
BLADE SURG 11 STRL SS (BLADE) IMPLANT
BLADE SURG 15 STRL LF C SS BP (BLADE) ×4 IMPLANT
BLADE SURG 15 STRL SS (BLADE) ×2
CANISTER SUCT 3000ML (MISCELLANEOUS) ×6 IMPLANT
CATH FOLEY 2WAY SLVR  5CC 18FR (CATHETERS) ×2
CATH FOLEY 2WAY SLVR 5CC 18FR (CATHETERS) ×4 IMPLANT
CLOTH BEACON ORANGE TIMEOUT ST (SAFETY) ×6 IMPLANT
CONT PATH 16OZ SNAP LID 3702 (MISCELLANEOUS) ×6 IMPLANT
DECANTER SPIKE VIAL GLASS SM (MISCELLANEOUS) ×12 IMPLANT
DRAPE SHEET LG 3/4 BI-LAMINATE (DRAPES) ×18 IMPLANT
DRAPE STERI URO 9X17 APER PCH (DRAPES) ×6 IMPLANT
GAUZE PACKING 1 X5 YD ST (GAUZE/BANDAGES/DRESSINGS) ×6 IMPLANT
GAUZE PACKING 2X5 YD STRL (GAUZE/BANDAGES/DRESSINGS) IMPLANT
GAUZE SPONGE 4X4 16PLY XRAY LF (GAUZE/BANDAGES/DRESSINGS) ×12 IMPLANT
GLOVE BIO SURGEON STRL SZ7.5 (GLOVE) ×6 IMPLANT
GLOVE BIOGEL PI IND STRL 7.0 (GLOVE) ×16 IMPLANT
GLOVE BIOGEL PI IND STRL 7.5 (GLOVE) ×4 IMPLANT
GLOVE BIOGEL PI INDICATOR 7.0 (GLOVE) ×8
GLOVE BIOGEL PI INDICATOR 7.5 (GLOVE) ×2
GLOVE ECLIPSE 6.5 STRL STRAW (GLOVE) ×12 IMPLANT
GOWN STRL REUS W/TWL LRG LVL3 (GOWN DISPOSABLE) ×42 IMPLANT
LIQUID BAND (GAUZE/BANDAGES/DRESSINGS) ×6 IMPLANT
NEEDLE HYPO 22GX1.5 SAFETY (NEEDLE) IMPLANT
NEEDLE SPNL 20GX3.5 QUINCKE YW (NEEDLE) ×6 IMPLANT
NS IRRIG 1000ML POUR BTL (IV SOLUTION) ×6 IMPLANT
PACK VAGINAL WOMENS (CUSTOM PROCEDURE TRAY) ×6 IMPLANT
PAD OB MATERNITY 4.3X12.25 (PERSONAL CARE ITEMS) ×6 IMPLANT
SET CYSTO W/LG BORE CLAMP LF (SET/KITS/TRAYS/PACK) ×6 IMPLANT
SLING TRANS VAGINAL TAPE (Sling) ×2 IMPLANT
SLING UTERINE/ABD GYNECARE TVT (Sling) ×4 IMPLANT
SUT MNCRL AB 3-0 PS2 27 (SUTURE) IMPLANT
SUT MNCRL AB 4-0 PS2 18 (SUTURE) IMPLANT
SUT VIC AB 0 CT1 18XCR BRD8 (SUTURE) ×12 IMPLANT
SUT VIC AB 0 CT1 27 (SUTURE) ×8
SUT VIC AB 0 CT1 27XBRD ANBCTR (SUTURE) ×16 IMPLANT
SUT VIC AB 0 CT1 8-18 (SUTURE) ×6
SUT VIC AB 2-0 CT1 27 (SUTURE)
SUT VIC AB 2-0 CT1 TAPERPNT 27 (SUTURE) IMPLANT
SUT VIC AB 2-0 CT2 27 (SUTURE) IMPLANT
SUT VIC AB 2-0 SH 27 (SUTURE) ×20
SUT VIC AB 2-0 SH 27XBRD (SUTURE) ×40 IMPLANT
SUT VIC AB 3-0 SH 27 (SUTURE) ×8
SUT VIC AB 3-0 SH 27X BRD (SUTURE) ×16 IMPLANT
SUT VICRYL 0 TIES 12 18 (SUTURE) ×6 IMPLANT
SYR TB 1ML 25GX5/8 (SYRINGE) ×6 IMPLANT
TOWEL OR 17X24 6PK STRL BLUE (TOWEL DISPOSABLE) ×12 IMPLANT
TRAY FOLEY CATH SILVER 14FR (SET/KITS/TRAYS/PACK) ×6 IMPLANT
WATER STERILE IRR 1000ML POUR (IV SOLUTION) IMPLANT

## 2016-05-18 NOTE — Interval H&P Note (Signed)
History and Physical Interval Note:  05/18/2016 8:23 AM  Priscilla Ramos  has presented today for surgery, with the diagnosis of Pelvic Prolapse with Stress Urinary Incontinence  The various methods of treatment have been discussed with the patient and family. After consideration of risks, benefits and other options for treatment, the patient has consented to  Procedure(s): HYSTERECTOMY VAGINAL BILATERAL SALPINGECTOMY (Bilateral) ANTERIOR (CYSTOCELE) AND POSTERIOR REPAIR (RECTOCELE) (N/A) CYSTOSCOPY (N/A) TRANSVAGINAL TAPE (TVT) PROCEDURE (N/A) as a surgical intervention .  The patient's history has been reviewed, patient examined, no change in status, stable for surgery.  I have reviewed the patient's chart and labs.  Questions were answered to the patient's satisfaction.     Berenize Gatlin A

## 2016-05-18 NOTE — Op Note (Addendum)
Preop Diagnosis: SUI  Postop Diagnosis: SUI  Procedure:1.TVT 2. Cystoscopy  Fluids: 700 cc  UOP: 100 cc  EBL: 50 cc  Complications:none  Procedure:The patient was in the operating room following procedure by Dr. Estanislado Pandyivard and in the dorsal lithotomy position.   A time out was performed and a deaver retractor was placed in the patient's vagina posteriorly and the anterior vaginal wall was injected with dilute pitressin at a concentration of 20 units of pitressin in a total of 100cc of normal saline.  An incision was made in the anterior wall of the vagina for approximately 1cm beneath the midurethra and the underlying tissue was dissected away from the anterior vaginal wall down to the level of the lower symphysis pubis bilaterally. Attention was then turned to the mons pubis where two 5 mm incisions were made 2 fingerbreadths from the midline. The transabdominal guide was then passed through the mons pubis incision on the patient's right down through the space of Retzius and out through the anterior vaginal wall after deflecting the rigid urethral catheter guide to the ipsilateral side. The same was done on the contralateral side. Cystoscopy was performed and no invadvertant bladder injury was noted. The bladder was drained with a Foley while deflecting the rigid urethral catheter guide to the patient's right and the mesh was attached to the transabdominal guide and elevated up through the space of Retzius and out through the incision on the mons pubis on the ipsilateral side. The same was done on the contralateral side. Cystoscopy was performed again and no inadvertant bladder injury was noted. The 3318 French Foley was left in the urethra and a large Tresa EndoKelly was placed between the urethra and the mesh in order to leave the mesh slack beneath the midurethra. The mesh was then cut flush with the skin at the mons pubis incisions bilaterally.  Cystoscopy was performed again and bilateral ureters were noted to  efflux without difficulty. The bilateral incisions on the mons pubis were then cleaned and Dermabond applied. The anterior vaginal wall incision was repaired with 2-0 vicryl with interrupted stitches.  Vagina was packed with estrogen soaked vaginal packing.  Sponge, lap and needle count was correct.  The patient tolerated the procedure well and was returned to the recovery room in good condition.

## 2016-05-18 NOTE — Transfer of Care (Signed)
Immediate Anesthesia Transfer of Care Note  Patient: Priscilla Ramos  Procedure(s) Performed: Procedure(s): HYSTERECTOMY VAGINAL BILATERAL SALPINGECTOMY (Bilateral) ANTERIOR (CYSTOCELE) AND POSTERIOR REPAIR (RECTOCELE) (N/A) TRANSVAGINAL TAPE (TVT) PROCEDURE (N/A) CYSTOSCOPY (N/A)  Patient Location: PACU  Anesthesia Type:Epidural  Level of Consciousness: sedated  Airway & Oxygen Therapy: Patient Spontanous Breathing  Post-op Assessment: Report given to RN  Post vital signs: Reviewed and stable  Last Vitals:  Vitals:   05/18/16 0727  BP: 107/82  Pulse: 83  Resp: 18  Temp: 36.7 C    Last Pain:  Vitals:   05/18/16 0727  TempSrc: Oral      Patients Stated Pain Goal: 3 (05/18/16 0727)  Complications: No apparent anesthesia complications

## 2016-05-18 NOTE — Op Note (Signed)
Preoperative diagnosis:Pelvic prolapse with stress urinary incontinence  Postoperative diagnosis: Same   Anesthesia: Spinal  Anesthesiologist: Dr Arby BarretteHatchett  Procedure: Total vaginal hysterectomy with anterior and posterior repair,perineoplasty and bilateral salpyngectomy  Surgeon: Dr. Dois DavenportSandra Kimberlea Schlag   Assistant: Henreitta LeberElmira Powell P.A.-C.   Estimated blood loss: 350 cc  Procedure:   After being informed of the planned procedure with possible complications including but not limited to infection, bleeding, injury to other organs, need for laparotomy, expected hospitals they and recovery time, informed consent was obtained and patient was taken to or #8.   She was given spinal anesthesia without any complication. She was placed in the lithotomy position prepped and draped in a sterile fashion and a Foley catheter was inserted in her bladder.   A weighted speculum is inserted in the vagina and the uterus was grasped with 1 Christella HartiganJacobs forcep. We infiltrate the mucosa around the cervix using Vasopressine 20 units/100 ml. We perform a circular incision around the cervix and then proceed with blunt and sharp dissection of the anterior and posterior vaginal mucosa. This identifies the posterior cul-de-sac which was sharply opened. The bladder was retracted upward . Both uterosacral ligaments are isolated on a Rogers forcep, sectioned, sutured with a transfixed suture of 0 Vicryl maintained for future suspension. The anterior cul-de-sac is then dissected up and the peritoneum is opened sharply. This allows for easy isolation of the vascular pedicle on each side using a Rogers forcep. The vascular pedicle is then sectioned and sutured with a transfixed suture of 0 Vicryl. We can then isolate part of the broad ligament on each side using a Rogers forceps. These pedicles are sectioned and sutured with a transfixed suture of 0 Vicryl. We now have easy access to the final pedicle incorporating the round ligament, the  utero-ovarian ligament and tube. This last pedicle is then clamped with Rogers forcep, sectioned and doubly sutured with a transfixed suture of 0 Vicryl and a free tie of 0 Vicryl. The uterus is removed.   Both ovaries are visualized and normal. We have easy access to both tubes so we proceed with bilateral salpingectomy by isolating the mesosalpinx on a Rogers forceps bilaterally. Each tube is then sharply removed and the pedicle is sutured with a transfix suture of 0 Vicryl and secured with a free tie of 0 Vicryl.   We then systematically verified hemostasis on all pedicles. Hemostasis is completed with 2 figure-of-eight sutures of 0 Vicryl on the right utero-sacral ligament. Hemostasis is then confirmed adequate. We proceed with closure of the posterior cul-de-sac by placing a suture reuniting the 2 uterosacral ligaments as well as the posterior cul-de-sac with 0 Vicryl. We then placed 2 vaginal suspension sutures using 0 Vicryl which will you reunite the posterior vaginal mucosa, the posterior peritoneum, the corresponding uterosacral ligaments and the anterior peritoneum. The sutures are then tied which closes the peritoneum at the same time.   Proceeding with anterior repair: We isolate the anterior vaginal mucosa using Allis forceps and infiltrated using Vasopressine 20 units/100 ml. The vaginal mucosa is then undermined medially and sharply dissected all the way to 1 cm below the urethrovesical junction. We proceed with sharp and blunt dissection of the prevesical fascia until the cystocele was completely freed. We then correct the cystocele with multilayered U- stitches of 2-0 Vicryl until complete resolution. Excess of vaginal mucosa is excised. The anterior vaginal mucosa is then closed using figure-of-eight stitches of 3-0 Vicryl.   The vaginal cuff is then closed transversely using  figure-of-eight sutures of 3-0 Vicryl.   Proceeding with posterior repair: We grasped the posterior forceps on a  distance of 2.5 cm infiltrate the posterior vaginal mucosa using 1% lidocaine with epinephrine 1 in 200,000. We sharply dissect the posterior vaginal mucosa midline until 2 cm from the vaginal cuff. We then sharply and bluntly dissect this mucosa away from the perirectal fascia until the rectocele was completely mobilized. We then correct the rectocele will multilayered U-stitches of 2-0 Vicryl until complete resolution. The excess vaginal mucosa is excised and the posterior vaginal mucosa is closed with a running lock suture of 3-0 Vicryl. The perineal muscles are then reapproximated with simple sutures of 3-0 Vicryl. The perineal skin is then closed with subcuticular suture of 3-0 Vicryl.   Instruments and sponge count is complete x2.  The procedure is well tolerated by the patient who is left under the care of Dr Su Hilt for placement of TVT ( see separate operative report).  Estimated blood loss is 350 cc   Specimen: Uterus with 2 tubes sent to pathology

## 2016-05-18 NOTE — Anesthesia Postprocedure Evaluation (Signed)
Anesthesia Post Note  Patient: Ardeen FillersJenny H Raspberry  Procedure(s) Performed: Procedure(s) (LRB): HYSTERECTOMY VAGINAL BILATERAL SALPINGECTOMY (Bilateral) ANTERIOR (CYSTOCELE) AND POSTERIOR REPAIR (RECTOCELE) (N/A) TRANSVAGINAL TAPE (TVT) PROCEDURE (N/A) CYSTOSCOPY (N/A)  Patient location during evaluation: [facility] Anesthesia Type: General Level of consciousness: awake and alert Pain management: satisfactory to patient Vital Signs Assessment: post-procedure vital signs reviewed and stable Respiratory status: spontaneous breathing and respiratory function stable Cardiovascular status: stable Postop Assessment: adequate PO intake Anesthetic complications: no     Last Vitals:  Vitals:   05/18/16 1816 05/18/16 2014  BP: 111/67 103/68  Pulse: 88 84  Resp: 17 16  Temp: 36.8 C 37 C    Last Pain:  Vitals:   05/18/16 2014  TempSrc: Oral  PainSc:    Pain Goal: Patients Stated Pain Goal: 3 (05/18/16 0727)               Karleen DolphinFUSSELL,Erikka Follmer

## 2016-05-18 NOTE — Anesthesia Procedure Notes (Signed)
Spinal  Patient location during procedure: OR Start time: 05/18/2016 8:45 AM End time: 05/18/2016 8:50 AM Staffing Anesthesiologist: Leilani AbleHATCHETT, Vignesh Willert Performed: anesthesiologist  Preanesthetic Checklist Completed: patient identified, surgical consent, pre-op evaluation, timeout performed, IV checked, risks and benefits discussed and monitors and equipment checked Spinal Block Patient position: sitting Prep: DuraPrep Patient monitoring: cardiac monitor, continuous pulse ox, blood pressure and heart rate Approach: midline Location: L3-4 Injection technique: catheter Needle Needle type: Tuohy and Sprotte  Needle gauge: 24 G Needle length: 12.7 cm Needle insertion depth: 6 cm Catheter type: closed end flexible Catheter size: 19 g Assessment Sensory level: T10

## 2016-05-19 DIAGNOSIS — Z90710 Acquired absence of both cervix and uterus: Secondary | ICD-10-CM | POA: Diagnosis present

## 2016-05-19 DIAGNOSIS — N812 Incomplete uterovaginal prolapse: Secondary | ICD-10-CM | POA: Diagnosis not present

## 2016-05-19 LAB — CBC
HCT: 29.7 % — ABNORMAL LOW (ref 36.0–46.0)
HEMOGLOBIN: 9.9 g/dL — AB (ref 12.0–15.0)
MCH: 29.6 pg (ref 26.0–34.0)
MCHC: 33.3 g/dL (ref 30.0–36.0)
MCV: 88.9 fL (ref 78.0–100.0)
PLATELETS: 287 10*3/uL (ref 150–400)
RBC: 3.34 MIL/uL — AB (ref 3.87–5.11)
RDW: 13.8 % (ref 11.5–15.5)
WBC: 5.4 10*3/uL (ref 4.0–10.5)

## 2016-05-19 MED ORDER — OXYCODONE-ACETAMINOPHEN 5-325 MG PO TABS: 1.0000 | ORAL_TABLET | ORAL | 0 refills | Status: DC | PRN

## 2016-05-19 MED ORDER — ONDANSETRON HCL 4 MG PO TABS: 4.0000 mg | ORAL_TABLET | Freq: Four times a day (QID) | ORAL | 0 refills | Status: DC | PRN

## 2016-05-19 MED ORDER — KETOROLAC TROMETHAMINE 30 MG/ML IJ SOLN
30.0000 mg | Freq: Four times a day (QID) | INTRAMUSCULAR | Status: AC
  Administered 2016-05-19 (×2): 30 mg via INTRAVENOUS
  Filled 2016-05-19 (×2): qty 1

## 2016-05-19 MED ORDER — IBUPROFEN 600 MG PO TABS: ORAL_TABLET | ORAL | 1 refills | Status: DC

## 2016-05-19 MED ORDER — CIPROFLOXACIN HCL 500 MG PO TABS: 500.0000 mg | ORAL_TABLET | Freq: Two times a day (BID) | ORAL | 0 refills | Status: DC

## 2016-05-19 NOTE — Progress Notes (Signed)
Priscilla Ramos is a40 y.o.  [id number]  Post Op Date # 1: TVH/BS/A-P Colporrhaphy/TVT/Cystoscopy  Subjective: Patient is Doing well postoperatively. Patient has Pain is controlled with current analgesics. Medications being used: prescription NSAID's including Ketorolac 30 mg. Patient was also given Tylenol throughout the night along with the Ketorolac.  Ambulated in halls without dizziness, tolerated regular diet last night without nausea but hasn't voided since Foley was removed at 6 a.m. and hasn't passed flatus.  Objective: Vital signs in last 24 hours: Temp:  [97.6 F (36.4 C)-99 F (37.2 C)] 98.2 F (36.8 C) (09/15 0400) Pulse Rate:  [70-100] 77 (09/15 0400) Resp:  [15-19] 18 (09/15 0400) BP: (100-122)/(55-87) 100/55 (09/15 0400) SpO2:  [94 %-100 %] 100 % (09/15 0400) Weight:  [155 lb (70.3 kg)] 155 lb (70.3 kg) (09/14 1816)  Intake/Output from previous day: 09/14 0701 - 09/15 0700 In: 3848.3 [P.O.:240; I.V.:3608.3] Out: 4025 [Urine:3575] Intake/Output this shift: No intake/output data recorded.  Recent Labs Lab 05/12/16 1548 05/19/16 0549  WBC 6.6 5.4  HGB 12.9 9.9*  HCT 38.7 29.7*  PLT 364 287     Recent Labs Lab 05/12/16 1548  NA 136  K 5.1  CL 103  CO2 27  BUN 15  CREATININE 0.85  CALCIUM 9.6  GLUCOSE 90    EXAM: General: alert, cooperative and no distress Resp: clear to auscultation bilaterally Cardio: regular rate and rhythm, S1, S2 normal, no murmur, click, rub or gallop GI: Bowel sounds active,  soft and mons incisions intact without evidence of infection. Extremities: Homans sign is negative, no sign of DVT and SCD hose in place-functioning.  No calf tenderness. Vaginal Bleeding: minimal   Assessment: s/p Procedure(s): HYSTERECTOMY VAGINAL BILATERAL SALPINGECTOMY ANTERIOR (CYSTOCELE) AND POSTERIOR REPAIR (RECTOCELE) TRANSVAGINAL TAPE (TVT) PROCEDURE CYSTOSCOPY: stable, progressing well, tolerating diet and anemia  Plan: Waiting for  patient to void  Monitor pain management on oral medicaton Consider discharge home today  LOS: 1 day    Sumiko Ceasar, PA-C 05/19/2016 7:38 AM

## 2016-05-19 NOTE — Progress Notes (Signed)
UR chart review completed.  

## 2016-05-19 NOTE — Progress Notes (Signed)
1 Day Post-Op Procedure(s) (LRB): HYSTERECTOMY VAGINAL BILATERAL SALPINGECTOMY (Bilateral) ANTERIOR (CYSTOCELE) AND POSTERIOR REPAIR (RECTOCELE) (N/A) TRANSVAGINAL TAPE (TVT) PROCEDURE (N/A) CYSTOSCOPY (N/A)  Subjective: Patient reports not feeling like she is emptying.     Objective: I have reviewed patient's vital signs and intake and output.  General: alert and no distress Resp: clear to auscultation bilaterally Cardio: regular rate and rhythm Extremities: Homans sign is negative, no sign of DVT Vaginal Bleeding: minimal  Bladder scan 517cc  Assessment: s/p Procedure(s): HYSTERECTOMY VAGINAL BILATERAL SALPINGECTOMY (Bilateral) ANTERIOR (CYSTOCELE) AND POSTERIOR REPAIR (RECTOCELE) (N/A) TRANSVAGINAL TAPE (TVT) PROCEDURE (N/A) CYSTOSCOPY (N/A): urinary retention s/p TVH/A-P Repair/BS/TVT/Cystoscopy  Plan: Will plan to rest bladder overnight and do a trial of void in the morning.  Pt is voiding adequate amounts but retaining as well.  Urine culture has been sent.  LOS: 1 day    Moo Gravley Y 05/19/2016, 6:51 PM

## 2016-05-19 NOTE — Discharge Summary (Signed)
Physician Discharge Summary  Patient ID: Priscilla Ramos MRN: [id number] DOB/AGE: 38/04/1976 38 y.o.  Admit date: 05/18/2016 Discharge date: 05/19/2016   Discharge Diagnoses: Symptomatic Pelvic Prolpase Active Problems:   Pelvic prolapse   Operation: Total Vaginal Hysterectomy, Bilateral Salpingectomy,  Anterior-Posterior Colporrhaphy, Placement of Tension Free Vaginal Tape and Cystoscopy   Discharged Condition: Good  Hospital Course:  On the date of admission the patient underwent the aforementioned procedures and tolerated them well.   Post operative course was unremarkable with the patient resuming bowel and bladder function by post operative day #2 and was therefore deemed ready for discharge home.  Discharge hemoglobin was 9.9.  D/C instructions reviewed.  Pt instructed to void frequently and bee seen in the office later this week.  Disposition: 01-Home or Self Care  Discharge Medications:    Medication List    TAKE these medications   atomoxetine 60 MG capsule Commonly known as:  STRATTERA Take 60 mg by mouth 2 (two) times daily with a meal.   escitalopram 20 MG tablet Commonly known as:  LEXAPRO Take 20 mg by mouth daily.   ibuprofen 600 MG tablet Commonly known as:  ADVIL,MOTRIN 1  po pc every 6 hours for 5 days then prn-pain   lamoTRIgine 200 MG tablet Commonly known as:  LAMICTAL Take 200 mg by mouth daily.   ondansetron 4 MG tablet Commonly known as:  ZOFRAN Take 1 tablet (4 mg total) by mouth every 6 (six) hours as needed for nausea.   oxyCODONE-acetaminophen 5-325 MG tablet Commonly known as:  PERCOCET/ROXICET Take 1-2 tablets by mouth every 4 (four) hours as needed for severe pain (moderate to severe pain (when tolerating fluids)).   triamcinolone cream 0.1 % Commonly known as:  KENALOG Apply 1 application topically 2 (two) times daily.       Cipro 500 mg  bid x 3 days   Follow-up: Dr.  Dois DavenportSandra A. Rivard on October 5947m 2017 at 10:30  a.m.   SignedHenreitta Leber: POWELL,ELMIRA, PA-C 05/19/2016, 7:58 AM

## 2016-05-19 NOTE — Discharge Instructions (Signed)
Call [facility] @ [phone] if:  You have a temperature greater than or equal to 100.4 degrees Farenheit orally You have pain that is not made better by the pain medication given and taken as directed You have excessive bleeding or problems urinating  Take Colace (Docusate Sodium/Stool Softener) 100 mg 2-3 times daily while taking narcotic pain medicine to avoid constipation or until bowel movements are regular. Take over the counter iron supplement of your choice,  twice a day for the next 12 weeks Take Ibuprofen 600 mg  with food every 6 hours for 5 days then as needed for pain  You may drive after 2  weeks You may walk up steps  You may shower  You may resume a regular diet  Keep incisions clean and dry Do not lift over 15 pounds for 6 weeks Avoid anything in vagina for 6 weeks (or until after your post-operative visit)

## 2016-05-20 DIAGNOSIS — N812 Incomplete uterovaginal prolapse: Secondary | ICD-10-CM | POA: Diagnosis not present

## 2016-05-20 LAB — URINE CULTURE: Culture: <10000 — AB

## 2016-05-20 NOTE — Progress Notes (Addendum)
2 Days Post-Op Procedure(s) (LRB): HYSTERECTOMY VAGINAL BILATERAL SALPINGECTOMY (Bilateral) ANTERIOR (CYSTOCELE) AND POSTERIOR REPAIR (RECTOCELE) (N/A) TRANSVAGINAL TAPE (TVT) PROCEDURE (N/A) CYSTOSCOPY (N/A)  Subjective: Patient reports tolerating PO and + flatus.  Pt reports not feeling any bladder pain but feels a little pressure.  Voiding adequate amounts and still retaining some but not uncomfortable.  Pt wants to continue to have frequent voids and restrict fluid intake over next few days.  Objective: I have reviewed patient's vital signs and intake and output.  General: alert and no distress Resp: clear to auscultation bilaterally Cardio: regular rate and rhythm GI: soft, app tender, ND, NABS, incisions c/d and liquaband intact Extremities: Homans sign is negative, no sign of DVT Vaginal Bleeding: minimal  Assessment: s/p Procedure(s): HYSTERECTOMY VAGINAL BILATERAL SALPINGECTOMY (Bilateral) ANTERIOR (CYSTOCELE) AND POSTERIOR REPAIR (RECTOCELE) (N/A) TRANSVAGINAL TAPE (TVT) PROCEDURE (N/A) CYSTOSCOPY (N/A): progressing well  Plan: Discharge home  D/C instructions reviewed Instructions given Will have pt f/u in the office next week.   LOS: 1 day    Shannel Zahm Y 05/20/2016, 11:16 AM

## 2016-05-20 NOTE — Progress Notes (Signed)
Pt. Is discharged in the care of husband,with N.T. Escort. per ambulatory. Noequipment needed for home use. Abdominal incisions are clean and dry. Stable. Understands all discharged instructions well. Stable.

## 2016-05-20 NOTE — Progress Notes (Signed)
MD called about pt's void and bladder scan. Pt given option for in and out cath, but reports she wants to try and pee on her own.

## 2016-05-21 ENCOUNTER — Encounter (HOSPITAL_COMMUNITY): Payer: Self-pay | Admitting: Obstetrics and Gynecology

## 2016-08-11 IMAGING — MG MM DIAGNOSTIC BILATERAL
8 series · 8 of 8 positions shown · non-contrast
Comparison: None.

CLINICAL DATA: Mass felt by the patient and on recent physical
examination in the upper outer right breast.

EXAM:
DIGITAL DIAGNOSTIC  BILATERAL MAMMOGRAM
ULTRASOUND RIGHT BREAST

[R CC (1 of 3)]
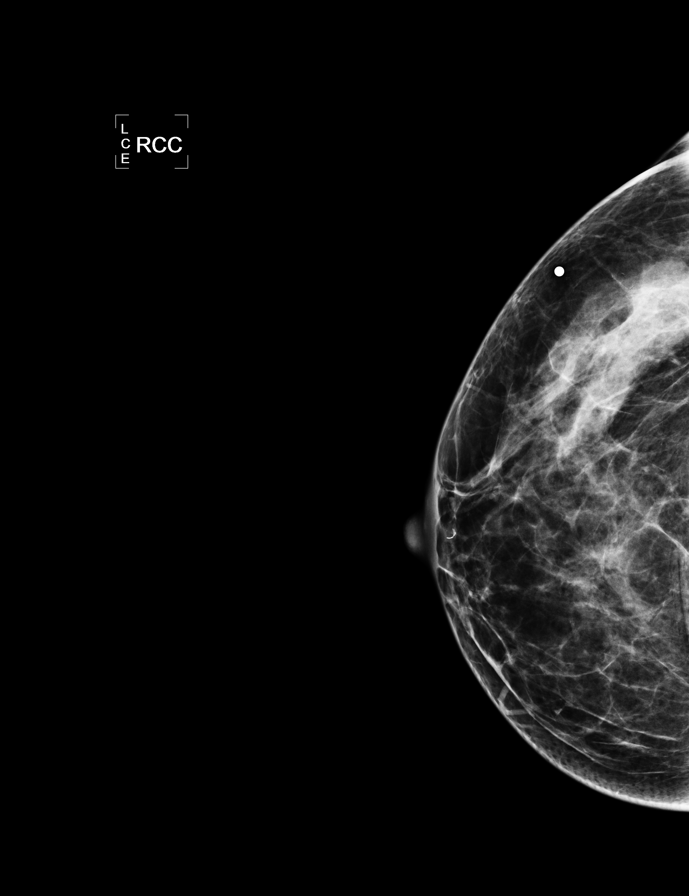

[L CC]
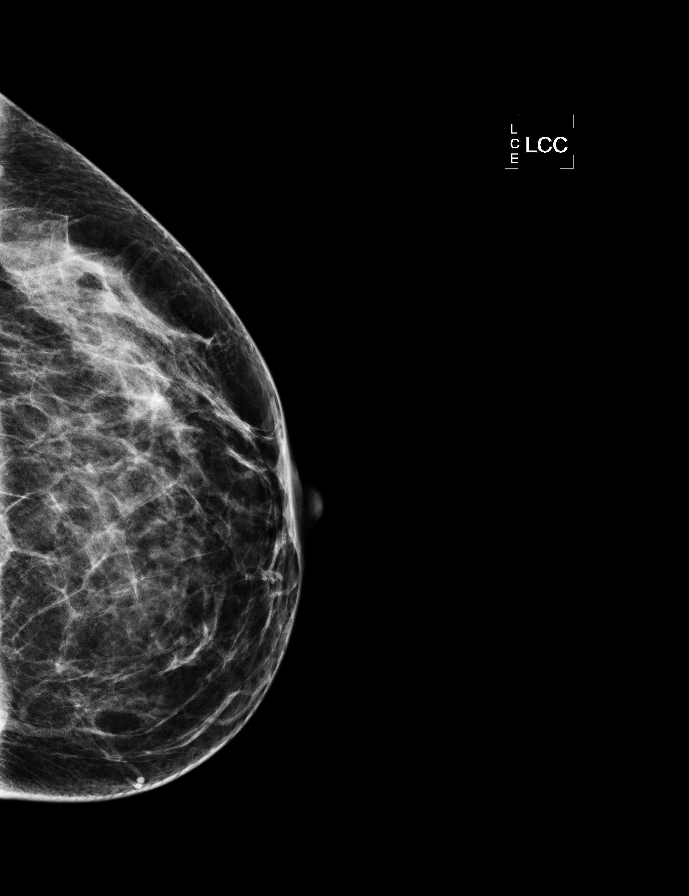

[L MLO]
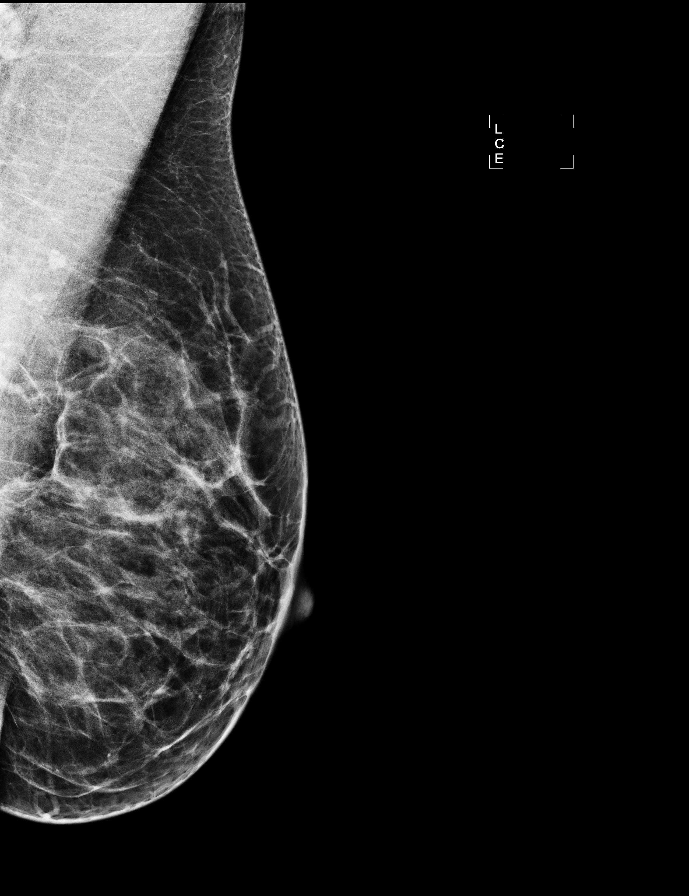

[R MLO]
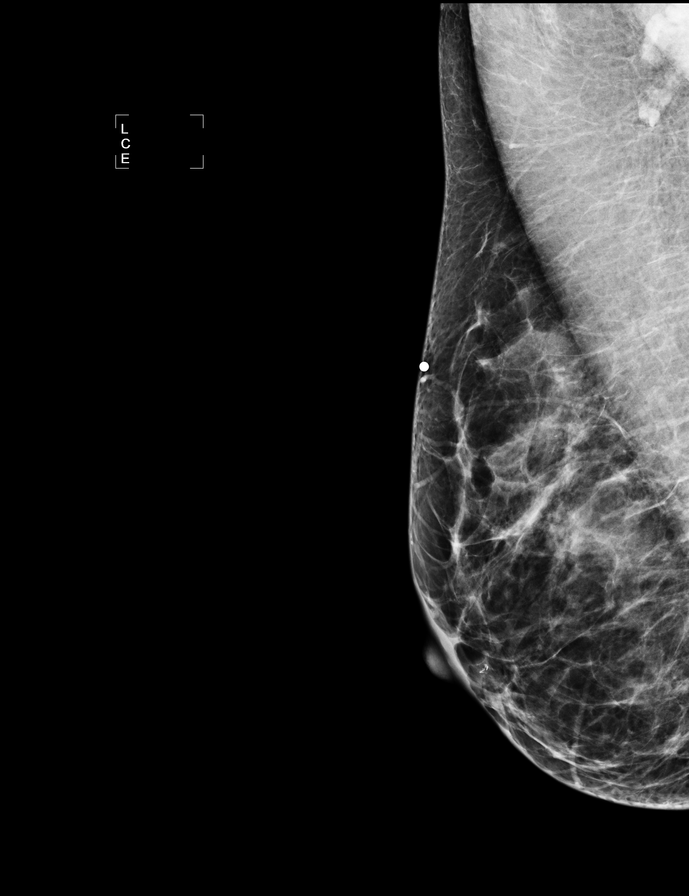

[R TAN]
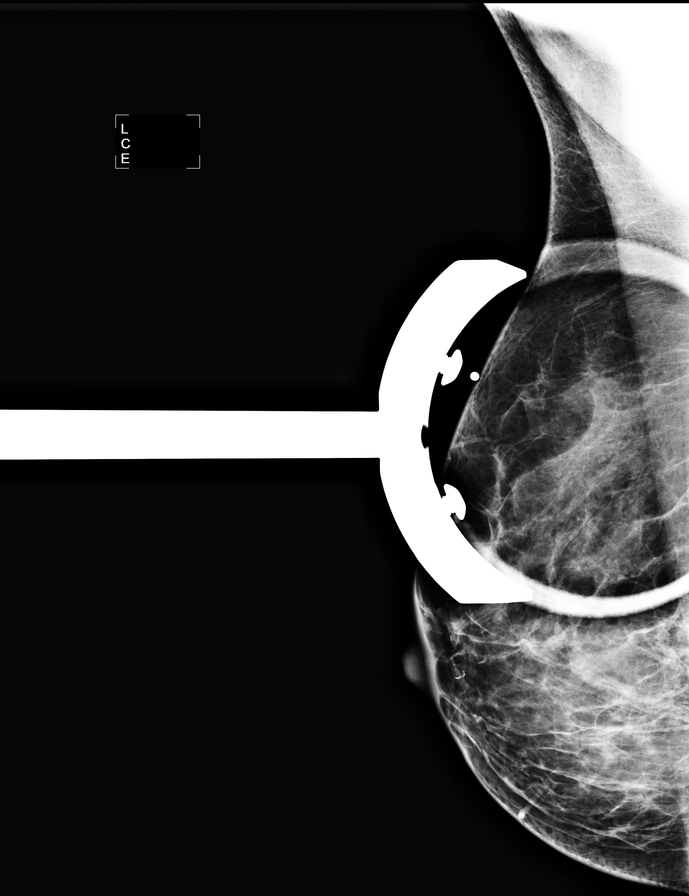

[R CC (2 of 3)]
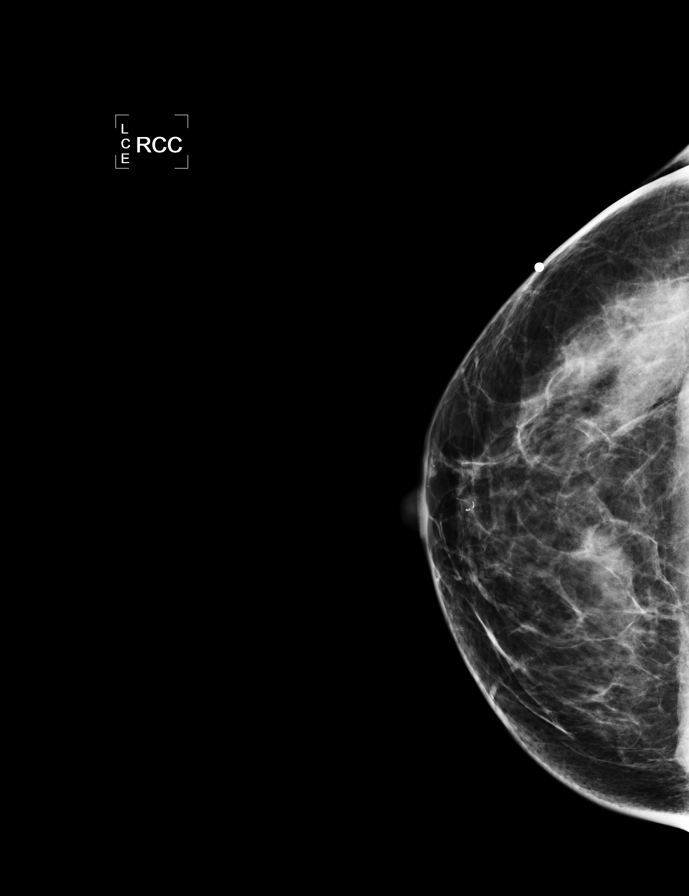

[R ML]
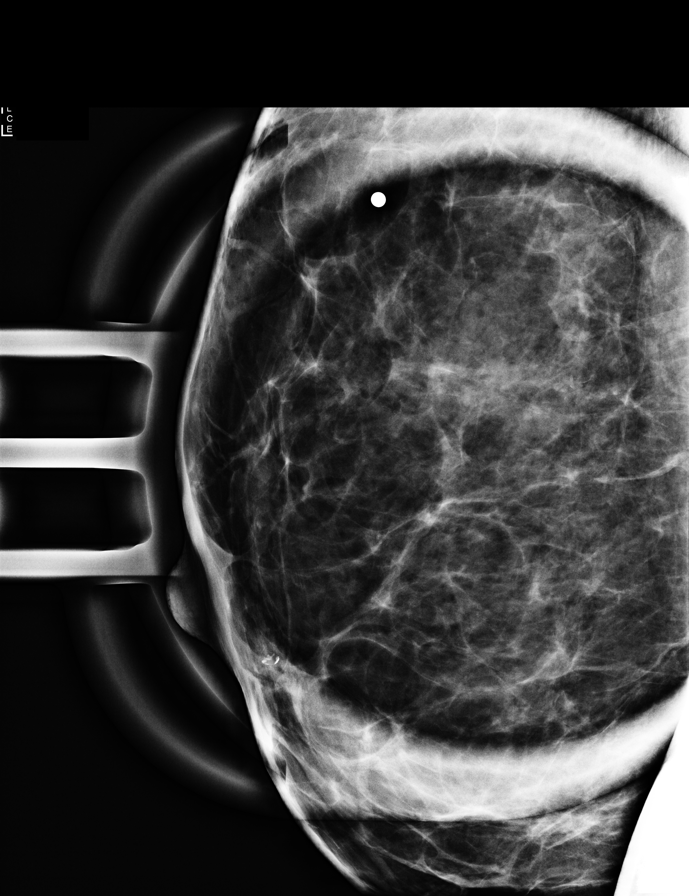

[R CC (3 of 3)]
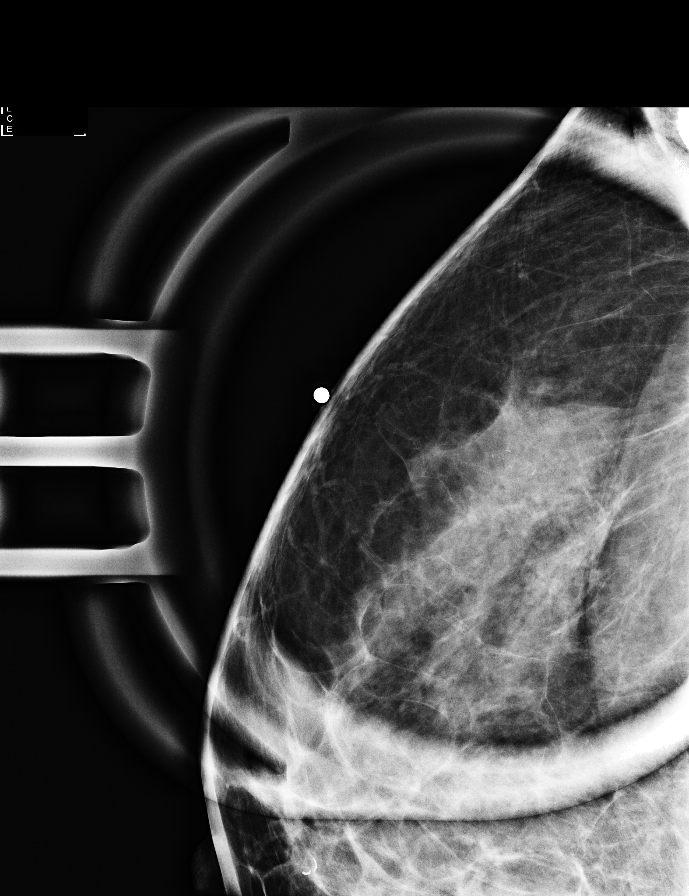

[8 of 8 positions shown; findings below may reference images not displayed]

ACR Breast Density Category c: The breast tissue is heterogeneously
dense, which may obscure small masses.
FINDINGS: Mammographically normal appearing breast tissue bilaterally with no
findings suspicious for malignancy.

On physical exam, there is an approximately 1.5 cm somewhat rounded
area of mild palpable soft tissue thickening in the 10:30 o'clock
position of the right breast, 8 cm from the nipple, without a
discrete palpable mass today.

Ultrasound is performed, showing a 7 x 7 x 3 mm cluster of
microcysts in the 10:30 o'clock position of the right breast, 8 cm
from the nipple. There is also dense glandular tissue with a convex
anterior margin in the 10:30 o'clock position of the right breast, 6
cm from the nipple.
IMPRESSION: Dense glandular tissue and 7 mm cluster of microcysts in the 10:30
o'clock position of the right breast. No evidence of malignancy.

RECOMMENDATION:
Annual screening mammography beginning at age 40.

I have discussed the findings and recommendations with the patient.
Results were also provided in writing at the conclusion of the
visit. If applicable, a reminder letter will be sent to the patient
regarding the next appointment.

BI-RADS CATEGORY  2: Benign.

## 2017-08-14 ENCOUNTER — Ambulatory Visit: Payer: BC Managed Care – PPO | Admitting: Family Medicine

## 2017-08-15 ENCOUNTER — Ambulatory Visit: Payer: BC Managed Care – PPO | Admitting: Family Medicine

## 2017-08-15 ENCOUNTER — Encounter: Payer: Self-pay | Admitting: Family Medicine

## 2017-08-15 VITALS — BP 120/82 | HR 93 | Temp 98.2°F | Ht 65.0 in | Wt 169.0 lb

## 2017-08-15 DIAGNOSIS — F909 Attention-deficit hyperactivity disorder, unspecified type: Secondary | ICD-10-CM | POA: Diagnosis not present

## 2017-08-15 DIAGNOSIS — N393 Stress incontinence (female) (male): Secondary | ICD-10-CM | POA: Insufficient documentation

## 2017-08-15 DIAGNOSIS — K429 Umbilical hernia without obstruction or gangrene: Secondary | ICD-10-CM | POA: Insufficient documentation

## 2017-08-15 NOTE — Progress Notes (Signed)
Subjective: CC: needs EKG for start on ADHD meds PCP: [name], Elige RadonJoshua A, MD NGE:XBMWUHPI:Priscilla Ramos is Ramos 41 y.o. female presenting to clinic today for:  Patient notes that she is currently seeing Ramos provider that is considering initiating stimulant medication for uncontrolled ADHD.  She notes that she is also on Lexapro and Lamictal and there is concern about starting stimulants while being treated with these medications.  She has previously been on Strattera and is currently on maximum dose of that with little improvement in her symptoms.  She provides in order for an EKG for stimulant therapy for ADHD.  She denies personal history of cardiovascular issues.  Denies chest pain, shortness of breath, dizziness, lower extremity swelling, heart palpitations, visual disturbance.  No personal history of hypertension, arrhythmia.  She does have Ramos family history of early cardiovascular disease in her father who had Ramos myocardial infarction in his 1340s.  Additionally, patient notes that she has been seen by Ramos dermatologist for her psoriasis and recently have laboratory work done for this.  She is going to be started on Ramos medication soon for her psoriasis.  She sees Ramos gynecologist and has routine Pap smears done there.  She had HIV screening during her last pregnancy 6 years ago.  She also had her tetanus done in her third trimester at that time.  She declines the influenza vaccine.  ROS: Per HPI  No Known Allergies Past Medical History:  Diagnosis Date  . Anxiety   . Bipolar disorder (HCC)     Current Outpatient Medications:  .  escitalopram (LEXAPRO) 20 MG tablet, Take 20 mg by mouth daily., Disp: , Rfl:  .  ibuprofen (ADVIL,MOTRIN) 600 MG tablet, 1  po pc every 6 hours for 5 days then prn-pain, Disp: 30 tablet, Rfl: 1 .  lamoTRIgine (LAMICTAL) 200 MG tablet, Take 200 mg by mouth 2 (two) times daily. , Disp: , Rfl:  .  lurasidone (LATUDA) 20 MG TABS tablet, Take 20 mg by mouth daily., Disp: , Rfl:    .  ondansetron (ZOFRAN) 4 MG tablet, Take 1 tablet (4 mg total) by mouth every 6 (six) hours as needed for nausea., Disp: 20 tablet, Rfl: 0 Social History   Socioeconomic History  . Marital status: Married    Spouse name: Not on file  . Number of children: Not on file  . Years of education: Not on file  . Highest education level: Not on file  Social Needs  . Financial resource strain: Not on file  . Food insecurity - worry: Not on file  . Food insecurity - inability: Not on file  . Transportation needs - medical: Not on file  . Transportation needs - non-medical: Not on file  Occupational History  . Not on file  Tobacco Use  . Smoking status: Never Smoker  . Smokeless tobacco: Never Used  Substance and Sexual Activity  . Alcohol use: Yes    Comment: occasionally  . Drug use: No  . Sexual activity: Not on file  Other Topics Concern  . Not on file  Social History Narrative  . Not on file   Family History  Problem Relation Age of Onset  . Heart disease Father     Objective: Office vital signs reviewed. BP 120/82   Pulse 93   Temp 98.2 F (36.8 C) (Oral)   Ht 5\' 5"  (1.651 m)   Wt 169 lb (76.7 kg)   BMI 28.12 kg/m   Physical Examination:  General:  Awake, alert, well nourished, No acute distress HEENT: Normal    Neck: No masses palpated. No lymphadenopathy, no JVD, no carotid bruits    Eyes: PERRLA, extraocular membranes intact, sclera white Cardio: regular rate and rhythm, S1S2 heard, no murmurs appreciated Pulm: clear to auscultation bilaterally, no wheezes, rhonchi or rales; normal work of breathing on room air Ext: Warm, well-perfused, no edema, +2 distal pulses.  Assessment/ Plan: 41 y.o. female   1. Attention deficit hyperactivity disorder (ADHD), unspecified ADHD type EKG reviewed and did not demonstrate any abnormalities.  There is no evidence of ischemia, arrhythmia or QT prolongation.  She demonstrates no cardiovascular symptoms.  Her physical exam was  unremarkable today.  Copy of EKG was provided to the patient.  Will fax [phone] as requested.  If her provider request formal cardiac clearance by cardiology, she will contact us for referral to cardiologist. - EKG 12-Lead  Orders Placed This Encounter  Procedures  . EKG 12-Lead    [name] Mesta, DO [facility] [phone]

## 2018-04-02 ENCOUNTER — Ambulatory Visit: Payer: BC Managed Care – PPO | Admitting: Family Medicine

## 2018-04-02 ENCOUNTER — Encounter: Payer: Self-pay | Admitting: Family Medicine

## 2018-04-02 VITALS — BP 121/85 | HR 112 | Temp 97.6°F | Ht 65.0 in | Wt 163.0 lb

## 2018-04-02 DIAGNOSIS — N3001 Acute cystitis with hematuria: Secondary | ICD-10-CM | POA: Diagnosis not present

## 2018-04-02 DIAGNOSIS — R300 Dysuria: Secondary | ICD-10-CM

## 2018-04-02 LAB — URINALYSIS, COMPLETE
BILIRUBIN UA: NEGATIVE
Glucose, UA: NEGATIVE
Ketones, UA: NEGATIVE
Nitrite, UA: NEGATIVE
PH UA: 7 (ref 5.0–7.5)
Specific Gravity, UA: 1.015 (ref 1.005–1.030)
Urobilinogen, Ur: 0.2 mg/dL (ref 0.2–1.0)

## 2018-04-02 LAB — MICROSCOPIC EXAMINATION
Bacteria, UA: NONE SEEN
Epithelial Cells (non renal): >10 /hpf — AB (ref 0–10)

## 2018-04-02 MED ORDER — PHENAZOPYRIDINE HCL 200 MG PO TABS: 200.0000 mg | ORAL_TABLET | Freq: Three times a day (TID) | ORAL | 0 refills | Status: DC | PRN

## 2018-04-02 MED ORDER — CEPHALEXIN 500 MG PO CAPS: 500.0000 mg | ORAL_CAPSULE | Freq: Four times a day (QID) | ORAL | 0 refills | Status: DC

## 2018-04-02 NOTE — Patient Instructions (Signed)

## 2018-04-02 NOTE — Progress Notes (Signed)
Subjective: CC: urinary symptoms PCP: [name], Elige Radon, MD Priscilla Ramos is a 42 y.o. female presenting to clinic today for:  1. Urinary symptoms Patient reports a 1 week h/o dysuria, urinary frequency and urgency .  She reports that she had about 3 or 4 days leftover of Macrobid and took this from July 20-23.  She states that symptoms seem to improve for a couple of days but then returned and have been present now for 4 days.  She reports that this morning she was woken from sleep with right flank pain that has since resolved.  Denies hematuria, fevers, chills, abdominal pain, nausea, vomiting, back pain, vaginal discharge.  Patient denies a h/o frequent or recurrent UTIs.  No history of renal stones.   ROS: Per HPI  No Known Allergies Past Medical History:  Diagnosis Date  . Anxiety   . Bipolar disorder (HCC)     Current Outpatient Medications:  .  escitalopram (LEXAPRO) 20 MG tablet, Take 20 mg by mouth daily., Disp: , Rfl:  .  lamoTRIgine (LAMICTAL) 200 MG tablet, Take 200 mg by mouth 2 (two) times daily. , Disp: , Rfl:  .  methylphenidate 36 MG PO CR tablet, Take 36 mg by mouth daily., Disp: , Rfl:  .  Secukinumab (COSENTYX SENSOREADY PEN) 150 MG/ML SOAJ, Inject 300 mg into the skin every 30 (thirty) days., Disp: , Rfl:  Social History   Socioeconomic History  . Marital status: Married    Spouse name: Not on file  . Number of children: Not on file  . Years of education: Not on file  . Highest education level: Not on file  Occupational History  . Not on file  Social Needs  . Financial resource strain: Not on file  . Food insecurity:    Worry: Not on file    Inability: Not on file  . Transportation needs:    Medical: Not on file    Non-medical: Not on file  Tobacco Use  . Smoking status: Never Smoker  . Smokeless tobacco: Never Used  Substance and Sexual Activity  . Alcohol use: Yes    Comment: occasionally  . Drug use: No  . Sexual activity: Not on  file  Lifestyle  . Physical activity:    Days per week: Not on file    Minutes per session: Not on file  . Stress: Not on file  Relationships  . Social connections:    Talks on phone: Not on file    Gets together: Not on file    Attends religious service: Not on file    Active member of club or organization: Not on file    Attends meetings of clubs or organizations: Not on file    Relationship status: Not on file  . Intimate partner violence:    Fear of current or ex partner: Not on file    Emotionally abused: Not on file    Physically abused: Not on file    Forced sexual activity: Not on file  Other Topics Concern  . Not on file  Social History Narrative  . Not on file   Family History  Problem Relation Age of Onset  . Heart disease Father     Objective: Office vital signs reviewed. BP 121/85   Pulse (!) 112   Temp 97.6 F (36.4 C) (Oral)   Ht 5\' 5"  (1.651 m)   Wt 163 lb (73.9 kg)   BMI 27.12 kg/m   Physical Examination:  General:  Awake, alert, well nourished, No acute distress GU: Minimal suprapubic tenderness to palpation MSK: No CVA tenderness to palpation.  Assessment/ Plan: 42 y.o. female   1. Acute cystitis with hematuria Patient is afebrile nontoxic-appearing.  Urinalysis with 1+ leukocytes, 1+ blood and 1+ protein.  Urine microscopy with 11-30 white blood cells, 3-10 red blood cells and mucus present.  Bacteria is absent.  I Priscilla wonder if use of Macrobid is obscuring picture.  I have sent this for urine culture.  Given other findings and symptoms will empirically treat to cover for pyelonephritis.  Keflex 500 mg 4 times daily for the next 7 days prescribed.  Pyridium as needed dysuria for the next 2 days.  Home care instructions reviewed.  Push oral fluids.  Reasons for return and emergent evaluation discussed.  Patient will follow-up as needed. - Urine Culture  2. Dysuria - Urinalysis, Complete   Orders Placed This Encounter  Procedures  . Urine  Culture  . Urinalysis, Complete   Meds ordered this encounter  Medications  . cephALEXin (KEFLEX) 500 MG capsule    Sig: Take 1 capsule (500 mg total) by mouth 4 (four) times daily for 7 days.    Dispense:  28 capsule    Refill:  0  . phenazopyridine (PYRIDIUM) 200 MG tablet    Sig: Take 1 tablet (200 mg total) by mouth 3 (three) times daily as needed for pain.    Dispense:  6 tablet    Refill:  0     Priscilla Ramos Priscilla Ramos, Priscilla Ramos [phone]) [phone]

## 2018-04-04 LAB — URINE CULTURE

## 2018-04-05 ENCOUNTER — Other Ambulatory Visit: Payer: Self-pay | Admitting: Family Medicine

## 2018-04-05 MED ORDER — NITROFURANTOIN MONOHYD MACRO 100 MG PO CAPS: 100.0000 mg | ORAL_CAPSULE | Freq: Two times a day (BID) | ORAL | 0 refills | Status: AC

## 2018-10-30 ENCOUNTER — Encounter: Payer: Self-pay | Admitting: Family Medicine

## 2018-10-30 ENCOUNTER — Ambulatory Visit: Payer: BC Managed Care – PPO | Admitting: Family Medicine

## 2018-10-30 VITALS — BP 120/81 | HR 80 | Temp 97.6°F | Ht 65.0 in | Wt 161.2 lb

## 2018-10-30 DIAGNOSIS — N3090 Cystitis, unspecified without hematuria: Secondary | ICD-10-CM | POA: Diagnosis not present

## 2018-10-30 DIAGNOSIS — R300 Dysuria: Secondary | ICD-10-CM | POA: Diagnosis not present

## 2018-10-30 DIAGNOSIS — L409 Psoriasis, unspecified: Secondary | ICD-10-CM

## 2018-10-30 LAB — URINALYSIS
Bilirubin, UA: NEGATIVE
Glucose, UA: NEGATIVE
Ketones, UA: NEGATIVE
LEUKOCYTES UA: NEGATIVE
NITRITE UA: POSITIVE — AB
PH UA: 6 (ref 5.0–7.5)
Protein, UA: NEGATIVE
SPEC GRAV UA: 1.025 (ref 1.005–1.030)
Urobilinogen, Ur: 0.2 mg/dL (ref 0.2–1.0)

## 2018-10-30 MED ORDER — CIPROFLOXACIN HCL 500 MG PO TABS: 500.0000 mg | ORAL_TABLET | Freq: Two times a day (BID) | ORAL | 0 refills | Status: DC

## 2018-10-30 NOTE — Progress Notes (Signed)
Subjective:  Patient ID: Priscilla Ramos, female    DOB: [birth year]  Ramos: 43 y.o. MRN: [id number]  CC: Urinary Tract Infection   HPI Priscilla Ramos presents for strong odor with urination and frequency for several weeks. Denies fever . No flank pain. No nausea, vomiting. No dysuria. Has had pelvic prolapse and bladder tack surgery. Prone to UTI ever since. Using Cosentyx for psoriasis. It works wonders for Priscilla Ramos.   Depression screen [facility] 2/9 04/02/2018 08/15/2017 01/07/2016  Decreased Interest 0 0 0  Down, Depressed, Hopeless 0 0 0  PHQ - 2 Score 0 0 0    History Priscilla Ramos has a past medical history of Anxiety and Bipolar disorder (HCC).   Priscilla Ramos has a past surgical history that includes Wisdom tooth extraction; Vaginal hysterectomy (Bilateral, 05/18/2016); Anterior and posterior repair (N/A, 05/18/2016); Bladder suspension (N/A, 05/18/2016); and Cystoscopy (N/A, 05/18/2016).   Priscilla Ramos family history includes Heart disease in Priscilla Ramos father.Priscilla Ramos reports that Priscilla Ramos has never smoked. Priscilla Ramos has never used smokeless tobacco. Priscilla Ramos reports current alcohol use. Priscilla Ramos reports that Priscilla Ramos does not use drugs.    ROS Review of Systems  Constitutional: Negative for chills, diaphoresis and fever.  HENT: Negative for congestion.   Eyes: Negative for visual disturbance.  Respiratory: Negative for cough and shortness of breath.   Cardiovascular: Negative for chest pain and palpitations.  Gastrointestinal: Negative for constipation, diarrhea and nausea.  Genitourinary: Positive for dysuria, frequency and urgency. Negative for decreased urine volume, flank pain, hematuria, menstrual problem and pelvic pain.  Musculoskeletal: Negative for arthralgias and joint swelling.  Skin: Negative for rash.  Neurological: Negative for dizziness and numbness.    Objective:  BP 120/81   Pulse 80   Temp 97.6 F (36.4 C) (Oral)   Ht 5\' 5"  (1.651 m)   Wt 161 lb 4 oz (73.1 kg)   BMI 26.83 kg/m   BP Readings from Last 3 Encounters:    10/30/18 120/81  04/02/18 121/85  08/15/17 120/82    Wt Readings from Last 3 Encounters:  10/30/18 161 lb 4 oz (73.1 kg)  04/02/18 163 lb (73.9 kg)  08/15/17 169 lb (76.7 kg)     Physical Exam Constitutional:      Appearance: Priscilla Ramos is well-developed.  HENT:     Head: Normocephalic and atraumatic.  Cardiovascular:     Rate and Rhythm: Normal rate and regular rhythm.     Heart sounds: No murmur.  Pulmonary:     Effort: Pulmonary effort is normal.     Breath sounds: Normal breath sounds.  Abdominal:     General: Bowel sounds are normal.     Palpations: Abdomen is soft. There is no mass.     Tenderness: There is no abdominal tenderness. There is no guarding or rebound.  Musculoskeletal:        General: No tenderness.  Skin:    General: Skin is warm and dry.  Neurological:     Mental Status: Priscilla Ramos is alert and oriented to person, place, and time.  Psychiatric:        Behavior: Behavior normal.       Assessment & Plan:   Priscilla Ramos was seen today for urinary tract infection.  Diagnoses and all orders for this visit:  Cystitis  Dysuria -     Urine Culture -     Urinalysis  Psoriasis  Other orders -     ciprofloxacin (CIPRO) 500 MG tablet; Take 1 tablet (500 mg total) by mouth 2 (two) times  daily.       I have discontinued Jenene Bartz. Grunder's methylphenidate and phenazopyridine. I am also having Priscilla Ramos start on ciprofloxacin. Additionally, I am having Priscilla Ramos maintain Priscilla Ramos lamoTRIgine, escitalopram, and Secukinumab.  Allergies as of 10/30/2018   No Known Allergies     Medication List       Accurate as of October 30, 2018  6:18 PM. Always use your most recent med list.        ciprofloxacin 500 MG tablet Commonly known as:  CIPRO Take 1 tablet (500 mg total) by mouth 2 (two) times daily.   COSENTYX SENSOREADY PEN 150 MG/ML Soaj Generic drug:  Secukinumab Inject 300 mg into the skin every 30 (thirty) days.   escitalopram 20 MG tablet Commonly known as:   LEXAPRO Take 20 mg by mouth daily.   lamoTRIgine 200 MG tablet Commonly known as:  LAMICTAL Take 200 mg by mouth 2 (two) times daily.        Follow-up: Return if symptoms worsen or fail to improve.  Mechele Claude, M.D.

## 2018-11-04 LAB — URINE CULTURE

## 2019-05-02 ENCOUNTER — Ambulatory Visit: Payer: BC Managed Care – PPO | Admitting: Physician Assistant

## 2019-05-02 ENCOUNTER — Other Ambulatory Visit: Payer: Self-pay

## 2019-05-02 ENCOUNTER — Encounter: Payer: Self-pay | Admitting: Physician Assistant

## 2019-05-02 VITALS — BP 124/78 | HR 91 | Temp 99.3°F | Ht 65.0 in | Wt 163.4 lb

## 2019-05-02 DIAGNOSIS — N3000 Acute cystitis without hematuria: Secondary | ICD-10-CM

## 2019-05-02 DIAGNOSIS — R300 Dysuria: Secondary | ICD-10-CM | POA: Diagnosis not present

## 2019-05-02 LAB — URINALYSIS, COMPLETE
Bilirubin, UA: NEGATIVE
Glucose, UA: NEGATIVE
Ketones, UA: NEGATIVE
Nitrite, UA: POSITIVE — AB
Protein,UA: NEGATIVE
Specific Gravity, UA: >1.03 — ABNORMAL HIGH (ref 1.005–1.030)
Urobilinogen, Ur: 0.2 mg/dL (ref 0.2–1.0)
pH, UA: 5.5 (ref 5.0–7.5)

## 2019-05-02 LAB — MICROSCOPIC EXAMINATION
Epithelial Cells (non renal): >10 /hpf — AB (ref 0–10)
Renal Epithel, UA: NONE SEEN /hpf

## 2019-05-02 MED ORDER — CIPROFLOXACIN HCL 500 MG PO TABS: 500.0000 mg | ORAL_TABLET | Freq: Two times a day (BID) | ORAL | 0 refills | Status: DC

## 2019-05-04 LAB — URINE CULTURE

## 2019-05-04 NOTE — Progress Notes (Signed)
BP 124/78   Pulse 91   Temp 99.3 F (37.4 C) (Temporal)   Ht 5\' 5"  (1.651 m)   Wt 163 lb 6.4 oz (74.1 kg)   BMI 27.19 kg/m    Subjective:    Patient ID: Priscilla Ramos, female    DOB: 11/09/1975, 43 y.o.   MRN: [id number]  HPI: Priscilla Ramos is a 43 y.o. female presenting on 05/02/2019 for Urinary Tract Infection  This patient has had several days of dysuria, frequency and nocturia. There is also pain over the bladder in the suprapubic region, no back pain. Denies leakage or hematuria.  Denies fever or chills. No pain in flank area.   Past Medical History:  Diagnosis Date  . Anxiety   . Bipolar disorder (HCC)    Relevant past medical, surgical, family and social history reviewed and updated as indicated. Interim medical history since our last visit reviewed. Allergies and medications reviewed and updated. DATA REVIEWED: CHART IN EPIC  Family History reviewed for pertinent findings.  Review of Systems  Constitutional: Negative.   HENT: Negative.   Eyes: Negative.   Respiratory: Negative.   Gastrointestinal: Negative.   Genitourinary: Positive for difficulty urinating, dysuria and urgency. Negative for flank pain.    Allergies as of 05/02/2019   No Known Allergies     Medication List       Accurate as of May 02, 2019 11:59 PM. If you have any questions, ask your nurse or doctor.        STOP taking these medications   Cosentyx Sensoready Pen 150 MG/ML Soaj Generic drug: Secukinumab Stopped by: Remus LofflerAngel S Gerardo Caiazzo, PA-C     TAKE these medications   ciprofloxacin 500 MG tablet Commonly known as: Cipro Take 1 tablet (500 mg total) by mouth 2 (two) times daily.   escitalopram 20 MG tablet Commonly known as: LEXAPRO Take 20 mg by mouth daily.   lamoTRIgine 200 MG tablet Commonly known as: LAMICTAL Take 200 mg by mouth 2 (two) times daily.          Objective:    BP 124/78   Pulse 91   Temp 99.3 F (37.4 C) (Temporal)   Ht 5\' 5"  (1.651 m)   Wt  163 lb 6.4 oz (74.1 kg)   BMI 27.19 kg/m   No Known Allergies  Wt Readings from Last 3 Encounters:  05/02/19 163 lb 6.4 oz (74.1 kg)  10/30/18 161 lb 4 oz (73.1 kg)  04/02/18 163 lb (73.9 kg)    Physical Exam Constitutional:      Appearance: She is well-developed.  HENT:     Head: Normocephalic and atraumatic.  Eyes:     Conjunctiva/sclera: Conjunctivae normal.     Pupils: Pupils are equal, round, and reactive to light.  Cardiovascular:     Rate and Rhythm: Normal rate and regular rhythm.     Heart sounds: Normal heart sounds.  Pulmonary:     Effort: Pulmonary effort is normal.     Breath sounds: Normal breath sounds.  Abdominal:     General: Bowel sounds are normal. There is no distension.     Palpations: Abdomen is soft. There is no mass.     Tenderness: There is abdominal tenderness in the suprapubic area. There is no guarding or rebound.  Skin:    General: Skin is warm and dry.     Findings: No rash.  Neurological:     Mental Status: She is alert and oriented to person, place,  and time.     Deep Tendon Reflexes: Reflexes are normal and symmetric.  Psychiatric:        Behavior: Behavior normal.        Thought Content: Thought content normal.        Judgment: Judgment normal.     Results for orders placed or performed in visit on 05/02/19  Urine Culture   Specimen: Urine   URINE  Result Value Ref Range   Urine Culture, Routine Preliminary report (A)    Organism ID, Bacteria Escherichia coli (A)   Microscopic Examination   URINE  Result Value Ref Range   WBC, UA 11-30 (A) 0 - 5 /hpf   RBC 0-2 0 - 2 /hpf   Epithelial Cells (non renal) >10 (A) 0 - 10 /hpf   Renal Epithel, UA None seen None seen /hpf   Bacteria, UA Many (A) None seen/Few  Urinalysis, Complete  Result Value Ref Range   Specific Gravity, UA >1.030 (H) 1.005 - 1.030   pH, UA 5.5 5.0 - 7.5   Color, UA Amber (A) Yellow   Appearance Ur Cloudy (A) Clear   Leukocytes,UA Trace (A) Negative    Protein,UA Negative Negative/Trace   Glucose, UA Negative Negative   Ketones, UA Negative Negative   RBC, UA Trace (A) Negative   Bilirubin, UA Negative Negative   Urobilinogen, Ur 0.2 0.2 - 1.0 mg/dL   Nitrite, UA Positive (A) Negative   Microscopic Examination See below:       Assessment & Plan:   1. Dysuria - Urine Culture - Urinalysis, Complete - Microscopic Examination  2. Acute cystitis without hematuria - ciprofloxacin (CIPRO) 500 MG tablet; Take 1 tablet (500 mg total) by mouth 2 (two) times daily.  Dispense: 20 tablet; Refill: 0   Continue all other maintenance medications as listed above.  Follow up plan: No follow-ups on file.  Educational handout given for Jackson PA-C [facility] [address]  [place], [place] [address] [phone]   05/04/2019, 3:46 PM

## 2019-06-10 ENCOUNTER — Ambulatory Visit (INDEPENDENT_AMBULATORY_CARE_PROVIDER_SITE_OTHER): Payer: BC Managed Care – PPO | Admitting: Family Medicine

## 2019-06-10 ENCOUNTER — Encounter: Payer: Self-pay | Admitting: Family Medicine

## 2019-06-10 DIAGNOSIS — S80862A Insect bite (nonvenomous), left lower leg, initial encounter: Secondary | ICD-10-CM

## 2019-06-10 DIAGNOSIS — S80861A Insect bite (nonvenomous), right lower leg, initial encounter: Secondary | ICD-10-CM | POA: Diagnosis not present

## 2019-06-10 DIAGNOSIS — W57XXXA Bitten or stung by nonvenomous insect and other nonvenomous arthropods, initial encounter: Secondary | ICD-10-CM | POA: Diagnosis not present

## 2019-06-10 MED ORDER — TRIAMCINOLONE ACETONIDE 0.1 % EX CREA: 1.0000 "application " | TOPICAL_CREAM | Freq: Two times a day (BID) | CUTANEOUS | 0 refills | Status: DC

## 2019-06-10 MED ORDER — LEVOCETIRIZINE DIHYDROCHLORIDE 5 MG PO TABS
5.0000 mg | ORAL_TABLET | Freq: Every evening | ORAL | 0 refills | Status: DC
Start: 2019-06-10 — End: 2019-07-02

## 2019-06-10 NOTE — Progress Notes (Signed)
   Virtual Visit via Telephone Note  I connected with Priscilla Ramos on 06/10/19 at 9:30 AM by telephone and verified that I am speaking with the correct person using two identifiers. Priscilla Ramos is currently located at work and nobody is currently with her during this visit. The provider, Loman Brooklyn, FNP is located in their home at time of visit.  I discussed the limitations, risks, security and privacy concerns of performing an evaluation and management service by telephone and the availability of in person appointments. I also discussed with the patient that there may be a patient responsible charge related to this service. The patient expressed understanding and agreed to proceed.  Subjective: PCP: [name], Priscilla Kaufmann, MD  Chief Complaint  Patient presents with  . Insect Bite   Patient c/o ~50 bug bites on her lower extremities. She believes she got in a bed of ticks when she was out walking in the woods with her son this weekend. She has tried "every over the counter cream" which is not providing relief from the itching. 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 Denies joint pains, muscle aches, fever, rash, nausea, and vomiting.    ROS: Per HPI  Current Outpatient Medications:  .  escitalopram (LEXAPRO) 20 MG tablet, Take 20 mg by mouth daily., Disp: , Rfl:  .  lamoTRIgine (LAMICTAL) 200 MG tablet, Take 200 mg by mouth 2 (two) times daily. , Disp: , Rfl:   No Known Allergies Past Medical History:  Diagnosis Date  . Anxiety   . Bipolar disorder ([place])     Observations/Objective: A&O  No respiratory distress or wheezing audible over the phone Mood, judgement, and thought processes all WNL  Assessment and Plan: 1. Bug bite, initial encounter - Education provided on insect bites. Patient to reach out if she starts to experience any joint pains, muscle aches, fever, rash, nausea, and vomiting. -  triamcinolone cream (KENALOG) 0.1 %; Apply 1 application topically 2 (two) times daily.  Dispense: 80 g; Refill: 0 - levocetirizine (XYZAL) 5 MG tablet; Take 1 tablet (5 mg total) by mouth every evening.  Dispense: 30 tablet; Refill: 0   Follow Up Instructions:  I discussed the assessment and treatment plan with the patient. The patient was provided an opportunity to ask questions and all were answered. The patient agreed with the plan and demonstrated an understanding of the instructions.   The patient was advised to call back or seek an in-person evaluation if the symptoms worsen or if the condition fails to improve as anticipated.  The above assessment and management plan was discussed with the patient. The patient verbalized understanding of and has agreed to the management plan. Patient is aware to call the clinic if symptoms persist or worsen. Patient is aware when to return to the clinic for a follow-up visit. Patient educated on when it is appropriate to go to the emergency department.   Time call ended: 9:36 AM  I provided 7 minutes of non-face-to-face time during this encounter.  Hendricks Limes, MSN, APRN, FNP-C [facility] 06/10/19

## 2019-06-10 NOTE — Patient Instructions (Signed)

## 2019-07-02 ENCOUNTER — Other Ambulatory Visit: Payer: Self-pay | Admitting: Family Medicine

## 2019-07-02 DIAGNOSIS — W57XXXA Bitten or stung by nonvenomous insect and other nonvenomous arthropods, initial encounter: Secondary | ICD-10-CM

## 2019-11-09 ENCOUNTER — Ambulatory Visit: Payer: BC Managed Care – PPO | Attending: Internal Medicine

## 2019-11-09 DIAGNOSIS — Z23 Encounter for immunization: Secondary | ICD-10-CM | POA: Insufficient documentation

## 2019-11-09 NOTE — Progress Notes (Signed)
   Covid-19 Vaccination Clinic  Name:  Priscilla Ramos    MRN: [id number] DOB: Dec 15, 1975  11/09/2019  [name] was observed post Covid-19 immunization for 15 minutes without incident. She was provided with Vaccine Information Sheet and instruction to access the V-Safe system.   [name] was instructed to call 911 with any severe reactions post vaccine: Marland Kitchen Difficulty breathing  . Swelling of face and throat  . A fast heartbeat  . A bad rash all over body  . Dizziness and weakness   Immunizations Administered    Name Date Dose VIS Date Route   Pfizer COVID-19 Vaccine 11/09/2019  1:04 PM 0.3 mL 08/15/2019 Intramuscular   Manufacturer: [employer], [employer]   Lot: [id number]   NDC: [address]-0

## 2019-11-30 ENCOUNTER — Ambulatory Visit: Payer: BC Managed Care – PPO | Attending: Internal Medicine

## 2019-11-30 DIAGNOSIS — Z23 Encounter for immunization: Secondary | ICD-10-CM

## 2019-11-30 NOTE — Progress Notes (Signed)
   Covid-19 Vaccination Clinic  Name:  Priscilla Ramos    MRN: [id number] DOB: 1976-05-30  11/30/2019  [name] was observed post Covid-19 immunization for 15 minutes without incident. She was provided with Vaccine Information Sheet and instruction to access the V-Safe system.   [name] was instructed to call 911 with any severe reactions post vaccine: Marland Kitchen Difficulty breathing  . Swelling of face and throat  . A fast heartbeat  . A bad rash all over body  . Dizziness and weakness   Immunizations Administered    Name Date Dose VIS Date Route   Pfizer COVID-19 Vaccine 11/30/2019 11:39 AM 0.3 mL 08/15/2019 Intramuscular   Manufacturer: [employer], [employer]   Lot: [id number]   NDC: [address]-6

## 2020-05-31 ENCOUNTER — Encounter: Payer: Self-pay | Admitting: Dermatology

## 2020-05-31 ENCOUNTER — Other Ambulatory Visit: Payer: Self-pay

## 2020-05-31 ENCOUNTER — Ambulatory Visit: Payer: BC Managed Care – PPO | Admitting: Dermatology

## 2020-05-31 DIAGNOSIS — L400 Psoriasis vulgaris: Secondary | ICD-10-CM | POA: Diagnosis not present

## 2020-05-31 DIAGNOSIS — L409 Psoriasis, unspecified: Secondary | ICD-10-CM

## 2020-05-31 NOTE — Patient Instructions (Addendum)
Visit for Priscilla Ramos date of birth 07-26-1976.  Because of Covid insurance issues she has been off of all psoriasis therapy for most of the past year with flareups particularly on the scalp and the arms.  Her psoriasis is quite itchy and she is quite aware that she incessantly picks at the spots.  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  She has never responded to topical therapy and asked whether she can go back on Cosentyx.  We will try to get this approved either through her insurance or directly with the Cosentyx folks.  I told her she should be equally comfortable if we had to switch to something like Skyrizi.  If you do not hear from our practice or [facility] within 2 weeks please call the office.  You will need to have your tuberculosis test up-to-date.

## 2020-06-03 LAB — QUANTIFERON-TB GOLD PLUS
Mitogen-NIL: >10 IU/mL
NIL: 0.2 IU/mL
QuantiFERON-TB Gold Plus: NEGATIVE
TB1-NIL: <0 IU/mL
TB2-NIL: <0 IU/mL

## 2020-06-17 ENCOUNTER — Telehealth: Payer: Self-pay | Admitting: *Deleted

## 2020-06-17 NOTE — Telephone Encounter (Signed)
Cosentyx DENIED.  Called [facility] to see next  Step.  Per [facility] pharmacy they have to complete "Welcome Call" before they can move forward.  Stated they have tried multiply times to contact patient with no success.  I called patient and she is going to call Michelene Heady today @ [phone].

## 2020-06-29 ENCOUNTER — Telehealth: Payer: Self-pay | Admitting: Dermatology

## 2020-06-29 NOTE — Telephone Encounter (Signed)
Patient is calling to say that she needs her shipment of Cosentyx.  Patient says that pharmacy needs verification of dosage and quantity before they will send shipment out.  Patient is in the "Covered Till Your Covered" program through [employer].

## 2020-06-30 NOTE — Telephone Encounter (Signed)
Phone call from [name] with [employer] stating that she was wrong and that I needed to speak with [facility] [facility] [employer] department to give clarification on the patient's [facility] prescription. Clarification of patients [facility] given to Priscilla Ramos, per Priscilla Ramos that's all they need and they will handle the rest.

## 2020-06-30 NOTE — Telephone Encounter (Signed)
Phone call from patient returning our call. I informed patient that we spoke with [facility] and they will be contacting her to restart filling her prescription.

## 2020-06-30 NOTE — Telephone Encounter (Signed)
Phone call to [facility] to see what information they needed from [place] so the patient can receive her [facility].  Per [name] with [employer] the patients last shipment was on 11/04/2019 and they closed the patients case due to patient not returning their calls.  Per [name] they can restart the patients case and send out whatever is needed from [place] or the patient.

## 2020-06-30 NOTE — Progress Notes (Signed)
° °  Follow-Up Visit   Subjective  Priscilla Ramos is a 43 y.o. female who presents for the following: Psoriasis (SCALP & RIGHT ELBOW FLARE (restart [facility]x)).  Psoriasis Location:  Duration:  Quality:  Associated Signs/Symptoms: Modifying Factors:  Severity:  Timing: Context:   Objective  Well appearing patient in no apparent distress; mood and affect are within normal limits.  A full examination was performed including scalp, head, eyes, ears, nose, lips, neck, chest, axillae, abdomen, back, buttocks, bilateral upper extremities, bilateral lower extremities, hands, feet, fingers, toes, fingernails, and toenails. All findings within normal limits unless otherwise noted below.   Assessment & Plan     Visit for Priscilla Ramos date of birth 1976/05/23.  Because of Covid insurance issues she has been off of all psoriasis therapy for most of the past year with flareups particularly on the scalp and the arms.  Her psoriasis is quite itchy and she is quite aware that she incessantly picks at the spots.  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  She has never responded to topical therapy and asked whether she can go back on [facility].  We will try to get this approved either through her insurance or directly with the [facility] folks.  I told her she should be equally comfortable if we had to switch to something like Skyrizi.  If you do not hear from our practice or [facility] within 2 weeks please call the office.  You will need to have your tuberculosis test up-to-date.   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 Patient will go to lab for TB test.   QuantiFERON-TB Gold Plus - Right Elbow - Posterior, Scalp     I, Janalyn Harder, MD, have  reviewed all documentation for this visit.  The documentation on 06/30/20 for the exam, diagnosis, procedures, and orders are all accurate and complete.

## 2020-06-30 NOTE — Telephone Encounter (Signed)
Phone call to patient to inform her that [facility] will be reaching out to her to reopen her [employer] C[employer] case.  Voicemail left for patient to give the office a call.

## 2020-07-04 ENCOUNTER — Encounter: Payer: Self-pay | Admitting: Dermatology

## 2020-08-23 ENCOUNTER — Telehealth: Payer: Self-pay | Admitting: *Deleted

## 2020-08-23 NOTE — Telephone Encounter (Signed)
Phone call to [facility] tp confirm that we got the message that patient is on the covered until your covered program.

## 2020-09-07 NOTE — Telephone Encounter (Signed)
Faxed [facility] the denial letter from [employer] for [employer], hard copy is in the patient chart

## 2021-06-09 ENCOUNTER — Ambulatory Visit: Payer: BC Managed Care – PPO | Admitting: Family Medicine

## 2021-06-10 ENCOUNTER — Ambulatory Visit: Payer: BC Managed Care – PPO | Admitting: Family Medicine

## 2021-06-10 ENCOUNTER — Other Ambulatory Visit: Payer: Self-pay

## 2021-06-10 VITALS — BP 116/76 | HR 80 | Temp 98.2°F | Ht 65.0 in | Wt 172.1 lb

## 2021-06-10 DIAGNOSIS — N3000 Acute cystitis without hematuria: Secondary | ICD-10-CM

## 2021-06-10 LAB — MICROSCOPIC EXAMINATION: RBC, Urine: NONE SEEN /hpf (ref 0–2)

## 2021-06-10 LAB — URINALYSIS, ROUTINE W REFLEX MICROSCOPIC
Bilirubin, UA: NEGATIVE
Glucose, UA: NEGATIVE
Ketones, UA: NEGATIVE
Nitrite, UA: POSITIVE — AB
Protein,UA: NEGATIVE
Specific Gravity, UA: 1.02 (ref 1.005–1.030)
Urobilinogen, Ur: 0.2 mg/dL (ref 0.2–1.0)
pH, UA: 7 (ref 5.0–7.5)

## 2021-06-10 MED ORDER — NITROFURANTOIN MONOHYD MACRO 100 MG PO CAPS: 100.0000 mg | ORAL_CAPSULE | Freq: Two times a day (BID) | ORAL | 0 refills | Status: AC

## 2021-06-10 NOTE — Progress Notes (Signed)
Acute Office Visit  Subjective:    Patient ID: Priscilla Ramos, female    DOB: [birth year], 45 y.o.   MRN: [id number]  Chief Complaint  Patient presents with   Urinary Tract Infection    HPI Patient is in today for cloudy, foul urine for 3 weeks. She denies fever, chills, flank pain, urgency, frequency, dysuria, or hematuria. She takes trimethoprim daily for UTI prophylaxis. She did miss a couple of doses before these symptoms started. She reports a UTI 1-2x a year despite prophylaxis.   Past Medical History:  Diagnosis Date   Anxiety    Bipolar disorder [facility])     Past Surgical History:  Procedure Laterality Date   ANTERIOR AND POSTERIOR REPAIR N/A 05/18/2016   Procedure: ANTERIOR (CYSTOCELE) AND POSTERIOR REPAIR (RECTOCELE);  Surgeon: Silverio Lay, MD;  Location: [facility];  Service: Gynecology;  Laterality: N/A;   BLADDER SUSPENSION N/A 05/18/2016   Procedure: TRANSVAGINAL TAPE (TVT) PROCEDURE;  Surgeon: Osborn Coho, MD;  Location: [facility];  Service: Gynecology;  Laterality: N/A;   CYSTOSCOPY N/A 05/18/2016   Procedure: CYSTOSCOPY;  Surgeon: Osborn Coho, MD;  Location: [facility];  Service: Gynecology;  Laterality: N/A;   VAGINAL HYSTERECTOMY Bilateral 05/18/2016   Procedure: HYSTERECTOMY VAGINAL BILATERAL SALPINGECTOMY;  Surgeon: Silverio Lay, MD;  Location: [facility];  Service: Gynecology;  Laterality: Bilateral;   WISDOM TOOTH EXTRACTION      Family History  Problem Relation Age of Onset   Heart disease Father     Social History   Socioeconomic History   Marital status: Married    Spouse name: Not on file   Number of children: Not on file   Years of education: Not on file   Highest education level: Not on file  Occupational History   Not on file  Tobacco Use   Smoking status: Never   Smokeless tobacco: Never  Vaping Use   Vaping Use: Never used  Substance and Sexual Activity   Alcohol use: Yes    Comment: occasionally   Drug use: No   Sexual activity: Not on  file  Other Topics Concern   Not on file  Social History Narrative   Not on file   Social Determinants of Health   Financial Resource Strain: Not on file  Food Insecurity: Not on file  Transportation Needs: Not on file  Physical Activity: Not on file  Stress: Not on file  Social Connections: Not on file  Intimate Partner Violence: Not on file    Outpatient Medications Prior to Visit  Medication Sig Dispense Refill   escitalopram (LEXAPRO) 20 MG tablet Take 20 mg by mouth daily.     lamoTRIgine (LAMICTAL) 200 MG tablet Take 200 mg by mouth 2 (two) times daily.      trimethoprim (TRIMPEX) 100 MG tablet Take 100 mg by mouth daily.     levocetirizine (XYZAL) 5 MG tablet TAKE 1 TABLET BY MOUTH EVERY DAY IN THE EVENING 90 tablet 0   triamcinolone cream (KENALOG) 0.1 % Apply 1 application topically 2 (two) times daily. 80 g 0   No facility-administered medications prior to visit.    No Known Allergies  Review of Systems As per HPI.     Objective:    Physical Exam Vitals and nursing note reviewed.  Constitutional:      General: She is not in acute distress.    Appearance: She is not ill-appearing, toxic-appearing or diaphoretic.  Pulmonary:     Effort: Pulmonary effort is normal. No  respiratory distress.  Abdominal:     General: Bowel sounds are normal. There is no distension.     Tenderness: There is no abdominal tenderness. There is no right CVA tenderness, left CVA tenderness, guarding or rebound.  Skin:    General: Skin is warm and dry.  Neurological:     General: No focal deficit present.     Mental Status: She is alert and oriented to person, place, and time.  Psychiatric:        Mood and Affect: Mood normal.        Behavior: Behavior normal.    BP 116/76   Pulse 80   Temp 98.2 F (36.8 C) (Temporal)   Ht 5\' 5"  (1.651 m)   Wt 172 lb 2 oz (78.1 kg)   BMI 28.64 kg/m  Wt Readings from Last 3 Encounters:  06/10/21 172 lb 2 oz (78.1 kg)  05/02/19 163 lb 6.4  oz (74.1 kg)  10/30/18 161 lb 4 oz (73.1 kg)   Urine dipstick shows positive for RBC's, positive for nitrates, and positive for leukocytes.  Micro exam: 0-5 WBC's per HPF, 0 RBC's per HPF, and many+ bacteria.  Health Maintenance Due  Topic Date Due   HIV Screening  Never done   Hepatitis C Screening  Never done   TETANUS/TDAP  Never done   COVID-19 Vaccine (3 - Booster for Pfizer series) 05/01/2020    There are no preventive care reminders to display for this patient.   No results found for: TSH Lab Results  Component Value Date   WBC 5.4 05/19/2016   HGB 9.9 (L) 05/19/2016   HCT 29.7 (L) 05/19/2016   MCV 88.9 05/19/2016   PLT 287 05/19/2016   Lab Results  Component Value Date   NA 136 05/12/2016   K 5.1 05/12/2016   CO2 27 05/12/2016   GLUCOSE 90 05/12/2016   BUN 15 05/12/2016   CREATININE 0.85 05/12/2016   CALCIUM 9.6 05/12/2016   ANIONGAP 6 05/12/2016   No results found for: CHOL No results found for: HDL No results found for: LDLCALC No results found for: TRIG No results found for: CHOLHDL No results found for: 07/12/2016     Assessment & Plan:   Priscilla Ramos was seen today for urinary tract infection.  Diagnoses and all orders for this visit:  Acute cystitis without hematuria Culture pending. Macrobid ordered. Discussed return precautions.  -     Urinalysis, Routine w reflex microscopic -     Urine Culture -     nitrofurantoin, macrocrystal-monohydrate, (MACROBID) 100 MG capsule; Take 1 capsule (100 mg total) by mouth 2 (two) times daily for 7 days. 1 po BId   Return to office for new or worsening symptoms, or if symptoms persist.   The patient indicates understanding of these issues and agrees with the plan.   Boneta Lucks, FNP

## 2021-06-15 LAB — URINE CULTURE

## 2021-07-04 ENCOUNTER — Encounter: Payer: Self-pay | Admitting: Family Medicine

## 2021-07-04 ENCOUNTER — Ambulatory Visit: Payer: BC Managed Care – PPO | Admitting: Family Medicine

## 2021-07-04 DIAGNOSIS — J988 Other specified respiratory disorders: Secondary | ICD-10-CM | POA: Diagnosis not present

## 2021-07-04 MED ORDER — AMOXICILLIN-POT CLAVULANATE 875-125 MG PO TABS: 1.0000 | ORAL_TABLET | Freq: Two times a day (BID) | ORAL | 0 refills | Status: AC

## 2021-07-04 MED ORDER — BENZONATATE 100 MG PO CAPS: 100.0000 mg | ORAL_CAPSULE | Freq: Three times a day (TID) | ORAL | 0 refills | Status: AC | PRN

## 2021-07-04 MED ORDER — FLUCONAZOLE 150 MG PO TABS: 150.0000 mg | ORAL_TABLET | Freq: Once | ORAL | 0 refills | Status: AC

## 2021-07-04 NOTE — Progress Notes (Signed)
   Virtual Visit  Note Due to COVID-19 pandemic this visit was conducted virtually. This visit type was conducted due to national recommendations for restrictions regarding the COVID-19 Pandemic (e.g. social distancing, sheltering in place) in an effort to limit this patient's exposure and mitigate transmission in our community. All issues noted in this document were discussed and addressed.  A physical exam was not performed with this format.  I connected with Verina Galeno Auker on 07/04/21 at 1102 by telephone and verified that I am speaking with the correct person using two identifiers. Adessa Primiano Timmers is currently located at work and no one is currently with her during the visit. The provider, Gabriel Earing, FNP is located in their office at time of visit.  I discussed the limitations, risks, security and privacy concerns of performing an evaluation and management service by telephone and the availability of in person appointments. I also discussed with the patient that there may be a patient responsible charge related to this service. The patient expressed understanding and agreed to proceed.  CC: cough  History and Present Illness:  HPI Jonisha reports head congestion and cough for 7 days. She reports that her cough is now productive for thick, brown sputum. She denies fever, body aches, chills, chest pain, shortness of breath, nausea, vomiting, or diarrhea. She has been taking mucinex and dayquil for her symptoms. She has had a negative Covid test.     ROS As per HPI.   Observations/Objective: Alert and oriented x 3. Able to speak in full sentences without difficulty.   Assessment and Plan: Mikaiah was seen today for cough.  Diagnoses and all orders for this visit:  Respiratory infection Symptoms x 7 days with recent worsening. Negative home Covid test. Augmentin and tessalon perles as below. Continue decongestant. Return to office for new or worsening symptoms, or if symptoms  persist.  -     amoxicillin-clavulanate (AUGMENTIN) 875-125 MG tablet; Take 1 tablet by mouth 2 (two) times daily for 7 days. -     benzonatate (TESSALON PERLES) 100 MG capsule; Take 1 capsule (100 mg total) by mouth 3 (three) times daily as needed for cough. -     fluconazole (DIFLUCAN) 150 MG tablet; Take 1 tablet (150 mg total) by mouth once for 1 dose.    Follow Up Instructions: As needed.     I discussed the assessment and treatment plan with the patient. The patient was provided an opportunity to ask questions and all were answered. The patient agreed with the plan and demonstrated an understanding of the instructions.   The patient was advised to call back or seek an in-person evaluation if the symptoms worsen or if the condition fails to improve as anticipated.  The above assessment and management plan was discussed with the patient. The patient verbalized understanding of and has agreed to the management plan. Patient is aware to call the clinic if symptoms persist or worsen. Patient is aware when to return to the clinic for a follow-up visit. Patient educated on when it is appropriate to go to the emergency department.   Time call ended:  1114  I provided 12 minutes of  non face-to-face time during this encounter.    Gabriel Earing, FNP

## 2021-07-05 ENCOUNTER — Encounter: Payer: Self-pay | Admitting: Family Medicine

## 2021-12-12 ENCOUNTER — Ambulatory Visit: Payer: BC Managed Care – PPO | Admitting: Dermatology

## 2023-05-17 ENCOUNTER — Emergency Department (HOSPITAL_BASED_OUTPATIENT_CLINIC_OR_DEPARTMENT_OTHER)
Admission: EM | Admit: 2023-05-17 | Discharge: 2023-05-17 | Disposition: A | Discharge status: Home / Self Care | Payer: BC Managed Care – PPO | Attending: Emergency Medicine | Admitting: Emergency Medicine

## 2023-05-17 ENCOUNTER — Emergency Department (HOSPITAL_BASED_OUTPATIENT_CLINIC_OR_DEPARTMENT_OTHER): Payer: BC Managed Care – PPO

## 2023-05-17 ENCOUNTER — Other Ambulatory Visit: Payer: Self-pay

## 2023-05-17 ENCOUNTER — Encounter (HOSPITAL_BASED_OUTPATIENT_CLINIC_OR_DEPARTMENT_OTHER): Payer: Self-pay | Admitting: Pediatrics

## 2023-05-17 DIAGNOSIS — R109 Unspecified abdominal pain: Secondary | ICD-10-CM | POA: Diagnosis present

## 2023-05-17 DIAGNOSIS — K5900 Constipation, unspecified: Secondary | ICD-10-CM | POA: Insufficient documentation

## 2023-05-17 LAB — URINALYSIS, MICROSCOPIC (REFLEX)

## 2023-05-17 LAB — COMPREHENSIVE METABOLIC PANEL
ALT: 18 U/L (ref 0–44)
AST: 18 U/L (ref 15–41)
Albumin: 4.8 g/dL (ref 3.5–5.0)
Alkaline Phosphatase: 65 U/L (ref 38–126)
Anion gap: 10 (ref 5–15)
BUN: 11 mg/dL (ref 6–20)
CO2: 25 mmol/L (ref 22–32)
Calcium: 9.4 mg/dL (ref 8.9–10.3)
Chloride: 102 mmol/L (ref 98–111)
Creatinine, Ser: 0.83 mg/dL (ref 0.44–1.00)
GFR, Estimated: >60 mL/min (ref >60)
Glucose, Bld: 94 mg/dL (ref 70–99)
Potassium: 3.6 mmol/L (ref 3.5–5.1)
Sodium: 137 mmol/L (ref 135–145)
Total Bilirubin: 0.7 mg/dL (ref 0.3–1.2)
Total Protein: 7.9 g/dL (ref 6.5–8.1)

## 2023-05-17 LAB — CBC
HCT: 43 % (ref 36.0–46.0)
Hemoglobin: 14.6 g/dL (ref 12.0–15.0)
MCH: 30.7 pg (ref 26.0–34.0)
MCHC: 34 g/dL (ref 30.0–36.0)
MCV: 90.3 fL (ref 80.0–100.0)
Platelets: 391 10*3/uL (ref 150–400)
RBC: 4.76 MIL/uL (ref 3.87–5.11)
RDW: 12.9 % (ref 11.5–15.5)
WBC: 7.7 10*3/uL (ref 4.0–10.5)
nRBC: 0 % (ref 0.0–0.2)

## 2023-05-17 LAB — URINALYSIS, ROUTINE W REFLEX MICROSCOPIC
Glucose, UA: NEGATIVE mg/dL
Ketones, ur: NEGATIVE mg/dL
Leukocytes,Ua: NEGATIVE
Nitrite: NEGATIVE
Protein, ur: NEGATIVE mg/dL
Specific Gravity, Urine: >=1.03 (ref 1.005–1.030)
pH: 6 (ref 5.0–8.0)

## 2023-05-17 LAB — LIPASE, BLOOD: Lipase: 21 U/L (ref 11–51)

## 2023-05-17 MED ORDER — KETOROLAC TROMETHAMINE 15 MG/ML IJ SOLN
15.0000 mg | Freq: Once | INTRAMUSCULAR | Status: AC
  Administered 2023-05-17: 15 mg via INTRAVENOUS
  Filled 2023-05-17: qty 1

## 2023-05-17 MED ORDER — SODIUM CHLORIDE 0.9 % IV BOLUS
1000.0000 mL | Freq: Once | INTRAVENOUS | Status: AC
  Administered 2023-05-17: 1000 mL via INTRAVENOUS

## 2023-05-17 MED ORDER — IOHEXOL 300 MG/ML  SOLN
100.0000 mL | Freq: Once | INTRAMUSCULAR | Status: AC | PRN
  Administered 2023-05-17: 100 mL via INTRAVENOUS

## 2023-05-17 NOTE — ED Triage Notes (Signed)
C/O abdominal pain started last night when it was more severe, and today it more dull in nature. Endorsed having BM that doesn't completely evacuate all stool, endorsed some nausea but no vomiting,

## 2023-05-17 NOTE — ED Notes (Signed)
Pt transported to CT via WC. 

## 2023-05-17 NOTE — ED Provider Notes (Signed)
[facility] Provider Note  CSN: [id number] Arrival date & time: 05/17/23 1421  Chief Complaint(s) Abdominal Pain  HPI Caira Lemmon Alyea is a 47 y.o. female with past medical history as below, significant for anxiety, bipolar disorder, ADHD, status post vaginal hysterectomy who presents to the ED with complaint of abdominal pain, constipation  Patient reports last night she had severe left lower quadrant and suprapubic abdominal pain difficulty having a bowel movement, she was nauseated but did not vomit.  She was able to have a bowel movement and the pain somewhat improved, still having some ongoing discomfort to her left lower quadrant.  No BRBPR or melena.  No nausea or vomiting ongoing.  No recent abdominal or pelvic trauma.  No abnormal abnormal vaginal bleeding or discharge.  No rashes.  Denies similar symptoms in the past.   Past Medical History Past Medical History:  Diagnosis Date   Anxiety    Bipolar disorder [facility])    Patient Active Problem List   Diagnosis Date Noted   Psoriasis 10/30/2018   Female stress incontinence 08/15/2017   Umbilical hernia 08/15/2017   Attention deficit hyperactivity disorder (ADHD) 08/15/2017   S/P vaginal hysterectomy 05/19/2016   S/P hysterectomy 05/19/2016   Pelvic prolapse 05/18/2016   Home Medication(s) Prior to Admission medications   Medication Sig Start Date End Date Taking? Authorizing Provider  benzonatate (TESSALON PERLES) 100 MG capsule Take 1 capsule (100 mg total) by mouth 3 (three) times daily as needed for cough. 07/04/21   Gabriel Earing, FNP  escitalopram (LEXAPRO) 20 MG tablet Take 20 mg by mouth daily.    [provider]  lamoTRIgine (LAMICTAL) 200 MG tablet Take 200 mg by mouth 2 (two) times daily.     [provider]  trimethoprim (TRIMPEX) 100 MG tablet Take 100 mg by mouth daily. 03/11/21   [provider]                                                                                                                                     Past Surgical History Past Surgical History:  Procedure Laterality Date   ANTERIOR AND POSTERIOR REPAIR N/A 05/18/2016   Procedure: ANTERIOR (CYSTOCELE) AND POSTERIOR REPAIR (RECTOCELE);  Surgeon: Silverio Lay, MD;  Location: [facility];  Service: Gynecology;  Laterality: N/A;   BLADDER SUSPENSION N/A 05/18/2016   Procedure: TRANSVAGINAL TAPE (TVT) PROCEDURE;  Surgeon: Osborn Coho, MD;  Location: [facility];  Service: Gynecology;  Laterality: N/A;   CYSTOSCOPY N/A 05/18/2016   Procedure: CYSTOSCOPY;  Surgeon: Osborn Coho, MD;  Location: [facility];  Service: Gynecology;  Laterality: N/A;   VAGINAL HYSTERECTOMY Bilateral 05/18/2016   Procedure: HYSTERECTOMY VAGINAL BILATERAL SALPINGECTOMY;  Surgeon: Silverio Lay, MD;  Location: [facility];  Service: Gynecology;  Laterality: Bilateral;   WISDOM TOOTH EXTRACTION     Family History Family History  Problem Relation Age of Onset  Heart disease Father     Social History Social History   Tobacco Use   Smoking status: Never   Smokeless tobacco: Never  Vaping Use   Vaping status: Never Used  Substance Use Topics   Alcohol use: Yes    Comment: occasionally   Drug use: No   Allergies Patient has no known allergies.  Review of Systems Review of Systems  Constitutional:  Negative for chills and fever.  Respiratory:  Negative for chest tightness and shortness of breath.   Cardiovascular:  Negative for chest pain and palpitations.  Gastrointestinal:  Positive for abdominal pain, constipation and nausea. Negative for anal bleeding, blood in stool, rectal pain and vomiting.  Genitourinary:  Negative for hematuria and urgency.  Musculoskeletal:  Negative for back pain.  Skin:  Negative for wound.  Neurological:  Negative for syncope.    Physical Exam Vital Signs  I have reviewed the triage vital signs BP (!) 130/94   Pulse 71   Temp 98 F (36.7 C) (Oral)    Resp 20   Ht 5\' 5"  (1.651 m)   Wt 77.1 kg   LMP 05/13/2016 (Exact Date)   SpO2 100%   BMI 28.29 kg/m  Physical Exam Vitals and nursing note reviewed.  Constitutional:      General: She is not in acute distress.    Appearance: She is well-developed. She is not ill-appearing.  HENT:     Head: Normocephalic and atraumatic.  Eyes:     Conjunctiva/sclera: Conjunctivae normal.  Cardiovascular:     Rate and Rhythm: Normal rate and regular rhythm.     Heart sounds: No murmur heard. Pulmonary:     Effort: Pulmonary effort is normal. No respiratory distress.     Breath sounds: Normal breath sounds.  Abdominal:     Palpations: Abdomen is soft.     Tenderness: There is abdominal tenderness in the right lower quadrant, suprapubic area and left lower quadrant. There is no guarding or rebound.  Musculoskeletal:        General: No swelling.     Cervical back: Neck supple.  Skin:    General: Skin is warm and dry.     Capillary Refill: Capillary refill takes less than 2 seconds.  Neurological:     Mental Status: She is alert.  Psychiatric:        Mood and Affect: Mood normal.     ED Results and Treatments Labs (all labs ordered are listed, but only abnormal results are displayed) Labs Reviewed  URINALYSIS, ROUTINE W REFLEX MICROSCOPIC - Abnormal; Notable for the following components:      Result Value   Hgb urine dipstick TRACE (*)    Bilirubin Urine SMALL (*)    All other components within normal limits  URINALYSIS, MICROSCOPIC (REFLEX) - Abnormal; Notable for the following components:   Bacteria, UA FEW (*)    All other components within normal limits  LIPASE, BLOOD  COMPREHENSIVE METABOLIC PANEL  CBC  Radiology CT ABDOMEN PELVIS W CONTRAST  Result Date: 05/17/2023 CLINICAL DATA:  Left lower quadrant pain EXAM: CT ABDOMEN AND PELVIS WITH CONTRAST TECHNIQUE:  Multidetector CT imaging of the abdomen and pelvis was performed using the standard protocol following bolus administration of intravenous contrast. RADIATION DOSE REDUCTION: This exam was performed according to the departmental dose-optimization program which includes automated exposure control, adjustment of the mA and/or kV according to patient size and/or use of iterative reconstruction technique. CONTRAST:  OMNIPAQUE IOHEXOL 300 MG/ML  SOLN COMPARISON:  CT abdomen and pelvis 09/17/2019 FINDINGS: Lower chest: No acute abnormality. Hepatobiliary: No focal liver abnormality is seen. No gallstones, gallbladder wall thickening, or biliary dilatation. Pancreas: Unremarkable. No pancreatic ductal dilatation or surrounding inflammatory changes. Spleen: Normal in size without focal abnormality. Adrenals/Urinary Tract: Adrenal glands are unremarkable. Kidneys are normal, without renal calculi, focal lesion, or hydronephrosis. Bladder is unremarkable. Stomach/Bowel: Stomach is within normal limits. Appendix appears normal. No evidence of bowel wall thickening, distention, or inflammatory changes. Vascular/Lymphatic: No significant vascular findings are present. No enlarged abdominal or pelvic lymph nodes. Reproductive: Status post hysterectomy. No adnexal masses. Other: No abdominal wall hernia or abnormality. No abdominopelvic ascites. Musculoskeletal: No acute or significant osseous findings. IMPRESSION: 1. No CT evidence of acute abdominal/pelvic process. 2. Status post hysterectomy. Electronically Signed   By: Darliss Cheney M.D.   On: 05/17/2023 20:26    Pertinent labs & imaging results that were available during my care of the patient were reviewed by me and considered in my medical decision making (see MDM for details).  Medications Ordered in ED Medications  ketorolac (TORADOL) 15 MG/ML injection 15 mg (15 mg Intravenous Given 05/17/23 1815)  sodium chloride 0.9 % bolus 1,000 mL (0 mLs Intravenous  Stopped 05/17/23 2107)  iohexol (OMNIPAQUE) 300 MG/ML solution 100 mL (100 mLs Intravenous Contrast Given 05/17/23 1836)                                                                                                                                     Procedures Procedures  (including critical care time)  Medical Decision Making / ED Course    Medical Decision Making:    INES TOENNIES is a 47 y.o. female  with past medical history as below, significant for anxiety, bipolar disorder, ADHD, status post vaginal hysterectomy who presents to the ED with complaint of abdominal pain, constipation. The complaint involves an extensive differential diagnosis and also carries with it a high risk of complications and morbidity.  Serious etiology was considered. Ddx includes but is not limited to: Differential diagnosis includes but is not exclusive to ectopic pregnancy, ovarian cyst, ovarian torsion, acute appendicitis, urinary tract infection, endometriosis, bowel obstruction, hernia, colitis, renal colic, gastroenteritis, volvulus etc.   Complete initial physical exam performed, notably the patient  was no acute distress, abdomen nonperitoneal.    Reviewed and confirmed nursing documentation for past medical history, family  history, social history.  Vital signs reviewed.    Clinical Course as of 05/17/23 2205  Thu May 17, 2023  2202 Feeling much better on recheck, tolerating p.o.  Labs and imaging are stable [SG]    Clinical Course User Index [SG] Sloan Leiter, DO     On recheck she is feeling much better, labs stable, imaging stable, tolerant p.o. without difficulty.  Symptoms have resolved  Given constipation instructions for home   The patient's overall condition has improved, the patient presents with abdominal pain without signs of peritonitis, or other life-threatening serious etiology. The patient understands that at this time there is no evidence for a more malignant  underlying process, but the patient also understands that early in the process of an illness, an emergency department workup can be falsely reassuring. Detailed discussions were had with the patient regarding current findings, and need for close f/u with PCP or on call doctor. The patient appears stable for discharge and has been instructed to return immediately if the symptoms worsen in any way for re-evaluation. Patient verbalized understanding and is in agreement with current care plan.  All questions answered prior to discharge.                Additional history obtained: -Additional history obtained from na -External records from outside source obtained and reviewed including: Chart review including previous notes, labs, imaging, consultation notes including  Home medications, primary care documentation   Lab Tests: -I ordered, reviewed, and interpreted labs.   The pertinent results include:   Labs Reviewed  URINALYSIS, ROUTINE W REFLEX MICROSCOPIC - Abnormal; Notable for the following components:      Result Value   Hgb urine dipstick TRACE (*)    Bilirubin Urine SMALL (*)    All other components within normal limits  URINALYSIS, MICROSCOPIC (REFLEX) - Abnormal; Notable for the following components:   Bacteria, UA FEW (*)    All other components within normal limits  LIPASE, BLOOD  COMPREHENSIVE METABOLIC PANEL  CBC    Notable for labs stable  EKG   EKG Interpretation Date/Time:    Ventricular Rate:    PR Interval:    QRS Duration:    QT Interval:    QTC Calculation:   R Axis:      Text Interpretation:           Imaging Studies ordered: I ordered imaging studies including CT abdomen pelvis I independently visualized the following imaging with scope of interpretation limited to determining acute life threatening conditions related to emergency care; findings noted above, significant for ET imaging stable I independently visualized and interpreted  imaging. I agree with the radiologist interpretation   Medicines ordered and prescription drug management: Meds ordered this encounter  Medications   ketorolac (TORADOL) 15 MG/ML injection 15 mg   sodium chloride 0.9 % bolus 1,000 mL   iohexol (OMNIPAQUE) 300 MG/ML solution 100 mL    -I have reviewed the patients home medicines and have made adjustments as needed   Consultations Obtained: na   Cardiac Monitoring: Continuous pulse oximetry interpreted by myself, 99% on RA.    Social Determinants of Health:  Diagnosis or treatment significantly limited by social determinants of health: non smoker   Reevaluation: After the interventions noted above, I reevaluated the patient and found that they have resolved  Co morbidities that complicate the patient evaluation  Past Medical History:  Diagnosis Date   Anxiety    Bipolar disorder (HCC)  Dispostion: Disposition decision including need for hospitalization was considered, and patient discharged from emergency department.    Final Clinical Impression(s) / ED Diagnoses Final diagnoses:  Abdominal pain, unspecified abdominal location  Constipation, unspecified constipation type        Sloan Leiter, DO 05/17/23 2205

## 2023-05-17 NOTE — Discharge Instructions (Addendum)
Please follow up with your primary care doctor within 2-3 days. For constipation we also recommend a diet high in fiber (beans, fruits, vegetables, whole grains). Take Colace 100-200 mg up to three times per day. You may take along with Senokot 1-2 tabs, ingest with full glass of water.  You may also take MiraLAX 1-2 capfuls 1-2 times a day until stools become soft and then slowly decrease the amount of MiraLAX used.  Maintain fluid intake 6-8 glasses per day. Please increase fibers in your diet. You may also take Milk of Magnesia 30 mL as needed for constipation, you may repeat in 2 hours again if no bowl movement.  It was a pleasure caring for you today in the emergency department.  Please return to the emergency department for any worsening or worrisome symptoms.
